# Patient Record
Sex: Female | Born: 1965 | Race: Black or African American | Hispanic: No | Marital: Married | Smoking: Never smoker
Health system: Southern US, Community
[De-identification: ages and names within clinical notes are randomized; demographics above are authoritative.]

## PROBLEM LIST (undated history)

## (undated) DIAGNOSIS — F419 Anxiety disorder, unspecified: Secondary | ICD-10-CM

## (undated) DIAGNOSIS — I10 Essential (primary) hypertension: Secondary | ICD-10-CM

## (undated) DIAGNOSIS — M1990 Unspecified osteoarthritis, unspecified site: Secondary | ICD-10-CM

## (undated) HISTORY — PX: OTHER SURGICAL HISTORY: SHX169

## (undated) HISTORY — DX: Unspecified osteoarthritis, unspecified site: M19.90

## (undated) HISTORY — DX: Anxiety disorder, unspecified: F41.9

## (undated) HISTORY — DX: Essential (primary) hypertension: I10

---

## 2001-09-28 ENCOUNTER — Other Ambulatory Visit: Admission: RE | Admit: 2001-09-28 | Discharge: 2001-09-28 | Payer: Self-pay | Admitting: Family Medicine

## 2002-11-14 ENCOUNTER — Other Ambulatory Visit: Admission: RE | Admit: 2002-11-14 | Discharge: 2002-11-14 | Payer: Self-pay | Admitting: Obstetrics and Gynecology

## 2002-11-20 ENCOUNTER — Encounter: Payer: Self-pay | Admitting: Obstetrics and Gynecology

## 2002-11-20 ENCOUNTER — Ambulatory Visit (HOSPITAL_COMMUNITY): Admission: RE | Admit: 2002-11-20 | Discharge: 2002-11-20 | Payer: Self-pay | Admitting: Obstetrics and Gynecology

## 2003-08-23 ENCOUNTER — Emergency Department (HOSPITAL_COMMUNITY): Admission: EM | Admit: 2003-08-23 | Discharge: 2003-08-23 | Payer: Self-pay | Admitting: Family Medicine

## 2003-11-08 ENCOUNTER — Other Ambulatory Visit: Admission: RE | Admit: 2003-11-08 | Discharge: 2003-11-08 | Payer: Self-pay | Admitting: Obstetrics and Gynecology

## 2004-05-16 ENCOUNTER — Ambulatory Visit (HOSPITAL_COMMUNITY): Admission: RE | Admit: 2004-05-16 | Discharge: 2004-05-16 | Payer: Self-pay | Admitting: Obstetrics and Gynecology

## 2004-05-29 ENCOUNTER — Ambulatory Visit (HOSPITAL_COMMUNITY): Admission: RE | Admit: 2004-05-29 | Discharge: 2004-05-29 | Payer: Self-pay | Admitting: Obstetrics and Gynecology

## 2004-06-02 ENCOUNTER — Encounter (INDEPENDENT_AMBULATORY_CARE_PROVIDER_SITE_OTHER): Payer: Self-pay | Admitting: Specialist

## 2004-06-02 ENCOUNTER — Inpatient Hospital Stay (HOSPITAL_COMMUNITY): Admission: RE | Admit: 2004-06-02 | Discharge: 2004-06-08 | Payer: Self-pay | Admitting: Obstetrics and Gynecology

## 2004-06-12 ENCOUNTER — Inpatient Hospital Stay (HOSPITAL_COMMUNITY): Admission: AD | Admit: 2004-06-12 | Discharge: 2004-06-14 | Payer: Self-pay | Admitting: Obstetrics and Gynecology

## 2004-06-13 ENCOUNTER — Encounter (INDEPENDENT_AMBULATORY_CARE_PROVIDER_SITE_OTHER): Payer: Self-pay | Admitting: *Deleted

## 2004-07-15 ENCOUNTER — Other Ambulatory Visit: Admission: RE | Admit: 2004-07-15 | Discharge: 2004-07-15 | Payer: Self-pay | Admitting: Obstetrics and Gynecology

## 2005-02-08 ENCOUNTER — Emergency Department (HOSPITAL_COMMUNITY): Admission: EM | Admit: 2005-02-08 | Discharge: 2005-02-08 | Payer: Self-pay | Admitting: Emergency Medicine

## 2005-09-18 ENCOUNTER — Other Ambulatory Visit: Admission: RE | Admit: 2005-09-18 | Discharge: 2005-09-18 | Payer: Self-pay | Admitting: Obstetrics and Gynecology

## 2006-09-27 ENCOUNTER — Other Ambulatory Visit: Admission: RE | Admit: 2006-09-27 | Discharge: 2006-09-27 | Payer: Self-pay | Admitting: Obstetrics and Gynecology

## 2007-07-18 ENCOUNTER — Encounter: Admission: RE | Admit: 2007-07-18 | Discharge: 2007-07-18 | Payer: Self-pay | Admitting: Internal Medicine

## 2007-07-28 ENCOUNTER — Encounter: Admission: RE | Admit: 2007-07-28 | Discharge: 2007-07-28 | Payer: Self-pay | Admitting: Internal Medicine

## 2008-01-22 ENCOUNTER — Emergency Department (HOSPITAL_COMMUNITY): Admission: EM | Admit: 2008-01-22 | Discharge: 2008-01-22 | Payer: Self-pay | Admitting: Family Medicine

## 2009-09-02 ENCOUNTER — Encounter: Admission: RE | Admit: 2009-09-02 | Discharge: 2009-09-02 | Payer: Self-pay | Admitting: Internal Medicine

## 2010-10-07 ENCOUNTER — Other Ambulatory Visit: Payer: Self-pay | Admitting: Obstetrics and Gynecology

## 2010-11-28 NOTE — Discharge Summary (Signed)
NAMEEUGENE, ZEIDERS                ACCOUNT NO.:  [id number]   MEDICAL RECORD NO.:  [id number]          PATIENT TYPE:  INP   LOCATION:  9107                          [facility]ILITY[facility]  [facility]   PHYSICIAN:  Rudy Jew. Ashley Royalty, M.D.DATE OF BIRTH:  August 03, 1965   DATE OF ADMISSION:  06/02/2004  DATE OF DISCHARGE:  06/08/2004                                 DISCHARGE SUMMARY   DISCHARGE DIAGNOSES:  1.  Intrauterine pregnancy at 40 weeks and 4 days gestation, delivered.  2.  Large for gestational age infant.  3.  Meconium-stained amniotic fluid.  4.  Postpartum preeclampsia, with hemolysis, elevated liver enzymes, and low      platelet count syndrome (HELLP).   CONSULTATIONS:  None.   DISCHARGE MEDICATIONS:  1.  Labetalol 100 mg p.o. b.i.d.  2.  Motrin.  3.  Percocet.   HISTORY AND PHYSICAL:  This is a 45 year old gravida 3, para 0, AB 2, with  an EDC of May 29, 2004, 40 weeks and 4 days gestation.  Prenatal care  was complicated by advanced maternal age, status post myomectomy at [facility]  with no violation of the endometrial cavity, positive HSV type 2, and  polyhydramnios.  The patient did have an amniocentesis for advanced maternal  age which revealed a normal fetus chromosomally.  Ultrasound on May 29, 2004 revealed an estimated fetal weight of approximately 4120 g.  After full  discussion of the risks and benefits of vaginal versus abdominal delivery,  the patient elected to proceed with primary cesarean section.  For the  remainder of the history and physical, please see the chart.   HOSPITAL COURSE:  The patient was admitted to [facility]  [place].  Admission laboratory studies were drawn.  On June 02, 2004,  she was taken to the operating room and underwent primary low transverse  cesarean section.  The procedure was accomplished by Dr. Sylvester Harder.  She delivered a 9 pound 0 ounce female with Apgars of 8 at one minute and 9  at five minutes who was sent to  the newborn nursery.  Arterial cord pH was  7.24.   The patient's initial postpartum course was complicated by thrombocytopenia  with a level of 101,000.  On the first postpartum day, the patient's blood  pressure was noted to be elevated.  A diagnosis of HELLP syndrome was made.  She was begun on magnesium sulfate therapy.  SGOT on November 22nd was 61.  On November 23rd, magnesium sulfate was continued.  The patient was begun on  labetalol 100 mg p.o. b.i.d. for blood pressures in the 165-170 range  systolic and 88 range diastolic.  On November 24th, the labetalol was  increased to 200 mg p.o. b.i.d.  Magnesium sulfate was discontinued.  The  patient was transferred to the floor.  On November 27th, the patient's SGOT  was noted to be diminishing at 41.  Platelet count was 176,000.  Blood  pressures were in the 130 range systolic and 70 range diastolic.  At this  point, the patient was felt to be stable for discharge.  She  was discharged  home on the aforementioned medications, afebrile and in satisfactory  condition.   DISPOSITION:  The patient is to return to [facility] and Obstetrics  in three to four days for assessment of her blood pressure and postoperative  assessment as well.      JAM/MEDQ  D:  07/03/2004  T:  07/04/2004  Job:  [id number]

## 2010-11-28 NOTE — Op Note (Signed)
[name] Skinner, [name] Skinner                ACCOUNT NO.:  [id number]   MEDICAL RECORD NO.:  [id number]          PATIENT TYPE:  INP   LOCATION:  9107                          [facility]   PHYSICIAN:  Rudy Jew. Ashley Royalty, M.D.DATE OF BIRTH:  22-Mar-1966   DATE OF PROCEDURE:  06/02/2004  DATE OF DISCHARGE:  06/08/2004                                 OPERATIVE REPORT   PREOPERATIVE DIAGNOSES:  1.  Intrauterine pregnancy at 40 weeks 4 days' gestation.  2.  Large for gestational age infant.   POSTOPERATIVE DIAGNOSES:  1.  Intrauterine pregnancy at 40 weeks 4 days' gestation.  2.  Meconium-stained amniotic fluid.   PROCEDURE:  Primary low transverse cesarean section.   SURGEON:  Rudy Jew. Ashley Royalty, M.D.   ASSISTANT:  Bing Neighbors. Sydnee Cabal, M.D.   ANESTHESIA:  Spinal.   FINDINGS:  A 9 pound 0 ounce female, Apgars 8 at one minute, 9 at five  minutes, sent to newborn nursery.  Arterial cord pH 7.24.   ESTIMATED BLOOD LOSS:  1000 mL.   COMPLICATIONS:  None.   PACKS AND DRAINS:  Foley.   Sponge, needle, and instrument count reported as correct x2.   PROCEDURE:  The patient was taken to the operating room and placed in the  sitting position.  After a spinal anesthetic was administered, she was  placed in the dorsal supine position and prepped and draped in the usual  manner for abdominal surgery.  A Foley catheter was placed.  The  Pfannenstiel incision was made down to the level of the fascia, which was  nicked with a knife, incised transversely with Mayo scissors.  The  underlying rectus muscles were separated from the overlying fascia using  sharp and blunt dissection.  The rectus muscles were separated in the  midline exposing the peritoneum, which was elevated with hemostats and  entered atraumatically with Metzenbaum scissors.  The incision was extended  longitudinally.  The uterus was identified and the bladder flap created by  incising the intrauterine serosa.  The bladder was  dissected inferiorly and  held in place with a bladder blade.  The uterus was then entered with a  combination of sharp and blunt dissection through a low transverse incision.  Meconium-stained amniotic fluid was encountered.  The infant was then  delivered from a vertex presentation.  The DeLee trap was used at delivery  of the head.  The pediatrics team was present at delivery.  At full  delivery, the umbilical cord was triply clamped, cut, and the infant given  immediately to the awaiting pediatrics team.  Arterial cord pH was obtained  from an isolated segment.  Regular cord blood was obtained.  Placenta and  membranes were delivered in their entirety and submitted to pathology for  histologic study.  The uterus was then closed with two running layers of #1  Vicryl.  The first was a running locking layer.  The second was a running,  intermittently locked and imbricating layer.  Hemostasis was noted.  The  uterus, tubes, and ovaries were found to be normal and returned to the  abdominal cavity.  Copious irrigation was accomplished.  The fascia was then  closed with 0 Vicryl in a running fashion.  The skin was closed with  staples.  The patient tolerated the procedure extremely well and was  returned to the recovery room in good condition.      JAM/MEDQ  D:  07/03/2004  T:  07/03/2004  Job:  [id number]

## 2010-11-28 NOTE — H&P (Signed)
[name] Skinner, BUCZKOWSKI                ACCOUNT NO.:  [id number]   MEDICAL RECORD NO.:  [id number]          PATIENT TYPE:  INP   LOCATION:  9198                          [facility]   PHYSICIAN:  Rudy Jew. Ashley Royalty, M.D.DATE OF BIRTH:  1965-09-03   DATE OF ADMISSION:  06/02/2004  DATE OF DISCHARGE:                                HISTORY & PHYSICAL   HISTORY OF PRESENT ILLNESS:  This is a 45 year old gravida 3, para 0, AB2,  EDC May 29, 2004, 40 weeks 4 days gestation.  Prenatal care has been  complicated by advanced maternal age, status post myomectomy at [facility] with  no violation of the cavity, HSV type 2 - gentle, polyhydramnios.  The  patient did have an amniocentesis for advanced maternal age.  The results  revealed a normal fetus chromosomally.  The patient had an ultrasound  performed May 29, 2004, due to the fact the presenting part was out of  the pelvis, and her cervix was closed.  Ultrasound revealed an estimated  fetal weight of approximately 4120 grams.  It was felt to be approximately  the 90th percentile for a [redacted] week gestation.  At that time, the amniotic  fluid volume was felt to be normal.  The patient is for C-section for large  for gestational age infant.   MEDICATIONS:  Vitamins.   PAST MEDICAL HISTORY:  As above.   PAST SURGICAL HISTORY:  As above.   ALLERGIES:  None.   FAMILY HISTORY:  Noncontributory.   SOCIAL HISTORY:  The patient denies the use of tobacco or significant  alcohol.   REVIEW OF SYSTEMS:  Noncontributory.   PHYSICAL EXAMINATION:  GENERAL:  Well-developed, well-nourished, pleasant  black female in no acute distress.  Afebrile.  VITAL SIGNS:  Stable.  SKIN:  Warm and dry without lesions.  LYMPHATIC:  There is no supraclavicular, cervical or inguinal adenopathy.  HEENT:  Normocephalic.  NECK:  Supple without thyromegaly.  CHEST:  Lungs are clear.  CARDIAC:  Regular rate and rhythm without murmurs, gallops or rubs.  BREASTS:  Exam  is deferred.  ABDOMEN:  Gravid with a term fundal height.  Fetal heart tones are  auscultated.  MUSCULOSKELETAL:  Examination reveals full range of motion.  PELVIC:  Examination (May 30, 2004) revealed the cervix to be closed  with the presenting part out of the pelvis.   IMPRESSION:  Large for gestational age infant.   PLAN:  I discussed with the patient the alternatives of induction versus  primary cesarean section for large for gestational infant.  Issues of  shoulder dystocia discussed.  Risks, benefits, alternatives of C-section  were discussed versus vaginal delivery fully discussed.  The patient elects  to go with the primary cesarean section.  Questions invited and answered.      JAM/MEDQ  D:  06/02/2004  T:  06/02/2004  Job:  [id number]

## 2010-11-28 NOTE — Consult Note (Signed)
[name] Skinner, [name] Skinner                ACCOUNT NO.:  [id number]   MEDICAL RECORD NO.:  [id number]          PATIENT TYPE:  INP   LOCATION:  9373                          [facility]   PHYSICIAN:  Corinna L. Lendell [name], MDDATE OF BIRTH:  Oct 15, 1965   DATE OF CONSULTATION:  06/12/2004  DATE OF DISCHARGE:  06/14/2004                                   CONSULTATION   REASON FOR CONSULTATION:  Hypertension.   IMPRESSIONS/RECOMMENDATIONS:  1.  Pregnancy-induced hypertension versus essential hypertension:  The      patient reports borderline high blood pressure of about 140/85      prepregnancy.  She has a strong family history of hypertension.  She      therefore may have essential hypertension.  I will add      hydrochlorothiazide and increase her labetalol to 200 mg twice a day.  I      will also give Clonidine as needed for uncontrolled hypertension.  In      addition, I will check a UA, a urine protein to creatinine ratio.  2.  Peripheral edema:  The patient also reports orthopnea.  She sleeps in      recliner.  I will get a chest x-ray, B-type natriuretic peptide, EKG,      TSH, and echocardiogram to rule out congestive heart failure or      cardiomyopathy.   HISTORY OF PRESENT ILLNESS:  [name] Skinner is a 45 year old black female, who  is status post C-section a little over a week ago.  She reportedly had  postpartum  hypertension and elevated liver function tests.  She received IV  magnesium and apparently was discharged.  She was directly admitted from Dr.  Ashley Royalty' office with a blood pressure of 168/100 and edema.  She has been  on labetalol 100 mg twice a day.  I was asked to assist with her  hypertension and edema.   PAST MEDICAL HISTORY:  None.   MEDICATIONS:  Labetalol 100 mg b.i.d.   SOCIAL HISTORY:  The patient denies drinking or drugs.   FAMILY HISTORY:  Significant for hypertension.   REVIEW OF SYSTEMS:  As above, otherwise negative.   PHYSICAL EXAMINATION:  VITAL  SIGNS:  Blood pressure is currently 150/80,  temperature 97.1, pulse 90, respiratory rate 20.  GENERAL:  The patient is an overweight black female in no acute distress.  HEENT:  Normocephalic, atraumatic.  Pupils equal, round, and reactive to  light.  Sclerae are nonicteric.  No papilledema.  Moist mucous membranes.  NECK:  Supple.  No lymphadenopathy.  No thyromegaly.  LUNGS:  Clear to auscultation bilaterally without wheezes, rhonchi, or  rales.  CARDIOVASCULAR:  Regular rate and rhythm without murmurs, gallops, rubs.  ABDOMEN:  Normal bowel sounds, soft, nontender, nondistended.  GU/RECTAL:  Deferred.  EXTREMITIES:  She has 3+ pitting edema.   Labs are at this time pending.   ASSESSMENT AND PLAN:  As above.   I would like to thank Dr. Ashley Royalty for this consultation.  Further  recommendations to follow.      CLS/MEDQ  D:  08/27/2004  T:  08/27/2004  Job:  [id number]

## 2010-11-28 NOTE — Discharge Summary (Signed)
[name] Skinner, [name] Skinner                ACCOUNT NO.:  [id number]   MEDICAL RECORD NO.:  [id number]          PATIENT TYPE:  INP   LOCATION:  9373                          [facility]   PHYSICIAN:  James A. Ashley Royalty, M.D.DATE OF BIRTH:  July 15, 1965   DATE OF ADMISSION:  06/12/2004  DATE OF DISCHARGE:  06/14/2004                                 DISCHARGE SUMMARY   DISCHARGE DIAGNOSIS:  Probable chronic hypertension with superimposed  preeclampsia (hemolysis, elevated liver function, and low platelet  syndrome).   OPERATIONS AND SPECIAL PROCEDURES:  None.   CONSULTATIONS:  Crista Curb, M.D. [facility]).   DISCHARGE MEDICATIONS:  1.  Hydrochlorothiazide 25 mg daily.  2.  Atenolol 25 mg one-half tablet b.i.d.  3.  Vitamins.  4.  Colace.   HISTORY OF PRESENT ILLNESS:  This is a 45 year old gravida 2 para 1 AB 1  status post cesarean section June 02, 2004.  The patient's postpartum  course was complicated by hypertension with elevated liver enzymes.  She was  felt to have initially preeclampsia with HELLP syndrome.  She was treated  with magnesium sulfate and later with labetalol.  The magnesium sulfate was  discontinued and the patient was allowed to be discharged home on labetalol.   The patient returned on the day of admission for blood pressure check at the  office.  She denied any visual disturbances, epigastric, or right upper  quadrant pain or headaches.  She did complain of some persistent dependent  edema.  Blood pressure in the office was 168 systolic, 100 diastolic.  The  patient was noted to have 2-3+ dependent edema.  As a precaution, pregnancy-  induced hypertension panel was obtained the same day.  The results showed a  hemoglobin of 8.9, uric acid 6.8, SGOT elevated at 80, and SGPT elevated at  76.  The patient was admitted for further evaluation and therapy.  For the  remainder of the history and physical, please see chart.   HOSPITAL COURSE:  The  patient was admitted to [facility]  [place].  Admission laboratory studies were drawn.  [facility]  were consulted.  There was a handwritten note in the chart.  At the time of  this dictation I could not find a dictated note in the chart, though such  was mentioned in the written note.  At any rate, the hypertension workup was  performed.  Chest x-ray revealed enlargement of the cardiomediastinal  silhouette without edema or focal infiltrate.  A 2-D echocardiogram was  obtained.  The left ventricular ejection fraction was estimated at 55-65%.  The study was felt to be inadequate for evaluation of LV motion.  Left  ventricular wall thickness was noted to be upper limits of normal.  The  patient's medications were adjusted per [facility].  On June 14, 2004 the blood pressures were noted to be normalizing nicely.  The patient  was then felt to be a candidate for discharge and was discharged home  afebrile and in satisfactory condition.  Appropriate appointments were made  with [facility] as well  as the [facility] for close follow-up.   ACCESSORY CLINICAL FINDINGS:  Hemoglobin and hematocrit on admission were  9.1 and 26.8 respectively.  Repeat values obtained June 14, 2004 were  10.5 and 31.4 respectively.  PIH panel obtained June 14, 2004 revealed  SGOT of 45, SGPT of 61.  TSH was 1.26.   DISPOSITION:  The patient is to return to [facility] and [facility]s  in several days for additional evaluation and therapy.      JAM/MEDQ  D:  07/03/2004  T:  07/04/2004  Job:  [id number]

## 2010-11-28 NOTE — H&P (Signed)
[name], [name]                ACCOUNT NO.:  [id number]   MEDICAL RECORD NO.:  [id number]          PATIENT TYPE:  INP   LOCATION:                                 FACILITY:   PHYSICIAN:  Rudy Jew. Ashley Royalty, M.D.DATE OF BIRTH:  12/17/1965   DATE OF ADMISSION:  06/12/2004  DATE OF DISCHARGE:                                HISTORY & PHYSICAL   This is a 45 year old, gravida 2, para 1, AB 1 status post cesarean section  June 02, 2004. The patient's postpartum course was complicated by  hypertension with elevated liver enzymes.  She was sent to the intensive  care unit and received IV magnesium sulfate for several days. The initial  SGOT on November 22 was 48. The maximum SGOT occurred November 23 and the  value was 61.  Subsequent values diminished throughout the remainder of the  patient's hospitalization.  The patient was discharged on June 08, 2004  with the SGOT trending downward at that point at a level of 41.  The SGPT  was never elevated. The patient was also anemic postoperatively having gone  from a preoperative hemoglobin of 11.6 to 7.5.  She was not symptomatic at  discharge and was discharged home on analgesics and iron. She was also  discharged home on labetalol 200 mg b.i.d.   The patient returned today for blood pressure check at the office.  She  denies any visual disturbances, epigastric or right upper quadrant pain,  headaches.  She does complain of some persistent dependent edema.  She  denies any shortness of breath.  Her exam was positive in the office for a  blood pressure of 168/100 and 2 to 3+ dependent edema.  In the interim, she  had been diminished to labetalol 100 mg b.i.d. after some interim pressures  had noted to be on the low side.  As a precaution, she was sent for a  subsequent PIH panel today. The results showed a hemoglobin of 8.9.  Uric  acid was 6.8.  SGOT was 80, SGPT was 76.  The patient was hence readmitted  for further evaluation and  therapy.   MEDICATIONS:  Labetalol 100 mg b.i.d., Chromagen analgesics.   PAST MEDICAL HISTORY:  Negative.   PAST SURGICAL HISTORY:  Status post cesarean section. Status post  myomectomy.   ALLERGIES:  None.   FAMILY HISTORY:  Noncontributory.   SOCIAL HISTORY:  The patient denies the use of tobacco or significant  alcohol.   REVIEW OF SYMPTOMS:  Noncontributory.   PHYSICAL EXAMINATION:  GENERAL:  Well-developed, well-nourished, pleasant  black female in no acute distress.  VITAL SIGNS:  Afebrile, vital signs as above.  SKIN:  Warm and dry without lesions.  LYMPH:  There is no supraclavicular, cervical or inguinal adenopathy.  HEENT:  Normocephalic.  NECK:  Supple without thyromegaly.  CHEST:  Lungs are clear.  CARDIAC:  Regular rate and rhythm without murmur, gallop or rub.  BREASTS:  Deferred.  ABDOMEN:  Soft and nontender without masses or organomegaly.  The abdominal  incision is healing quite well.  MUSCULOSKELETAL:  Reveals 2  to 3+ dependent edema.  DTR's appear 2+ and  equal.  PELVIC:  Deferred.   LABORATORY DATA:  As above.   IMPRESSION:  1.  Status post cesarean section June 02, 2004.  2.  Preeclampsia with elevated liver enzymes--initially resolving with      recent exacerbation.  3.  Rule out chronic hypertension.   PLAN:  Readmit for further evaluation and therapy.  The patient will be  reinitiated on magnesium sulfate.  In addition will consult the [facility]  hospitalist.  Discussed with Dr. Sydnee Cabal who is covering for the patient  this evening.      JAM/MEDQ  D:  06/12/2004  T:  06/12/2004  Job:  [id number]

## 2011-02-12 ENCOUNTER — Emergency Department (HOSPITAL_COMMUNITY)
Admission: EM | Admit: 2011-02-12 | Discharge: 2011-02-12 | Disposition: A | Discharge status: Home / Self Care | Payer: Private Health Insurance - Indemnity | Attending: Emergency Medicine | Admitting: Emergency Medicine

## 2011-02-12 DIAGNOSIS — E876 Hypokalemia: Secondary | ICD-10-CM | POA: Insufficient documentation

## 2011-02-12 DIAGNOSIS — M79609 Pain in unspecified limb: Secondary | ICD-10-CM | POA: Insufficient documentation

## 2011-02-12 DIAGNOSIS — I10 Essential (primary) hypertension: Secondary | ICD-10-CM | POA: Insufficient documentation

## 2011-02-12 DIAGNOSIS — R252 Cramp and spasm: Secondary | ICD-10-CM | POA: Insufficient documentation

## 2011-02-12 LAB — DIFFERENTIAL
Basophils Absolute: 0.1 10*3/uL (ref 0.0–0.1)
Basophils Relative: 1 % (ref 0–1)
Eosinophils Absolute: 0.4 10*3/uL (ref 0.0–0.7)
Eosinophils Relative: 4 % (ref 0–5)
Lymphocytes Relative: 28 % (ref 12–46)
Lymphs Abs: 2.9 10*3/uL (ref 0.7–4.0)
Monocytes Absolute: 0.6 10*3/uL (ref 0.1–1.0)
Monocytes Relative: 6 % (ref 3–12)
Neutro Abs: 6.5 10*3/uL (ref 1.7–7.7)
Neutrophils Relative %: 62 % (ref 43–77)

## 2011-02-12 LAB — CBC
HCT: 32.2 % — ABNORMAL LOW (ref 36.0–46.0)
Hemoglobin: 10.8 g/dL — ABNORMAL LOW (ref 12.0–15.0)
MCH: 29.1 pg (ref 26.0–34.0)
MCHC: 33.5 g/dL (ref 30.0–36.0)
MCV: 86.8 fL (ref 78.0–100.0)
Platelets: 250 10*3/uL (ref 150–400)
RBC: 3.71 MIL/uL — ABNORMAL LOW (ref 3.87–5.11)
RDW: 13.5 % (ref 11.5–15.5)
WBC: 10.5 10*3/uL (ref 4.0–10.5)

## 2011-02-12 LAB — COMPREHENSIVE METABOLIC PANEL
ALT: 13 U/L (ref 0–35)
AST: 15 U/L (ref 0–37)
Albumin: 3 g/dL — ABNORMAL LOW (ref 3.5–5.2)
Alkaline Phosphatase: 41 U/L (ref 39–117)
BUN: 11 mg/dL (ref 6–23)
CO2: 28 mEq/L (ref 19–32)
Calcium: 9.5 mg/dL (ref 8.4–10.5)
Chloride: 99 mEq/L (ref 96–112)
Creatinine, Ser: 0.84 mg/dL (ref 0.50–1.10)
GFR calc Af Amer: >60 mL/min (ref >60)
GFR calc non Af Amer: >60 mL/min (ref >60)
Glucose, Bld: 144 mg/dL — ABNORMAL HIGH (ref 70–99)
Potassium: 2.8 mEq/L — ABNORMAL LOW (ref 3.5–5.1)
Sodium: 138 mEq/L (ref 135–145)
Total Bilirubin: 0.2 mg/dL — ABNORMAL LOW (ref 0.3–1.2)
Total Protein: 7.8 g/dL (ref 6.0–8.3)

## 2011-07-17 ENCOUNTER — Other Ambulatory Visit: Payer: Self-pay | Admitting: Internal Medicine

## 2011-07-17 DIAGNOSIS — Z1231 Encounter for screening mammogram for malignant neoplasm of breast: Secondary | ICD-10-CM

## 2011-08-03 ENCOUNTER — Ambulatory Visit
Admission: RE | Admit: 2011-08-03 | Discharge: 2011-08-03 | Disposition: A | Discharge status: Home / Self Care | Payer: Private Health Insurance - Indemnity | Source: Ambulatory Visit | Attending: Internal Medicine | Admitting: Internal Medicine

## 2011-08-03 DIAGNOSIS — Z1231 Encounter for screening mammogram for malignant neoplasm of breast: Secondary | ICD-10-CM

## 2012-06-10 ENCOUNTER — Ambulatory Visit (INDEPENDENT_AMBULATORY_CARE_PROVIDER_SITE_OTHER): Payer: Managed Care, Other (non HMO) | Admitting: Emergency Medicine

## 2012-06-10 VITALS — BP 128/76 | HR 150 | Temp 100.7°F | Resp 18 | Ht 64.0 in | Wt 257.0 lb

## 2012-06-10 DIAGNOSIS — J209 Acute bronchitis, unspecified: Secondary | ICD-10-CM

## 2012-06-10 MED ORDER — HYDROCOD POLST-CHLORPHEN POLST 10-8 MG/5ML PO LQCR: 5.0000 mL | Freq: Two times a day (BID) | ORAL | Status: DC | PRN

## 2012-06-10 MED ORDER — AZITHROMYCIN 250 MG PO TABS: ORAL_TABLET | ORAL | Status: DC

## 2012-06-10 NOTE — Progress Notes (Signed)
[facility] [address] [place] [address] [phone]  Date:  06/10/2012   Name:  [name] Skinner   DOB:  1966/05/25   MRN:  [id number]  PCP:  No primary provider on file.    Chief Complaint: Cough and Headache   History of Present Illness:  [name] Skinner is a 46 y.o. very pleasant female patient who presents with the following:  Ill yesterday with cough and sore throat.  Has frontal headache.  No shortness of breath or wheezing.  Has fever 101.2.  No chills, nausea or vomiting.  No nasal discharge or drainage.  Non productive cough.  No improvement with OTC.  Appetite fine.  There is no problem list on file for this patient.   Past Medical History  Diagnosis Date  . Hypertension     Past Surgical History  Procedure Date  . Fibroid     History  Substance Use Topics  . Smoking status: Never Smoker   . Smokeless tobacco: Not on file  . Alcohol Use: No    Family History  Problem Relation Age of Onset  . Hypertension Mother   . Hypertension Father   . Diabetes Maternal Grandmother     No Known Allergies  Medication list has been reviewed and updated.  Current Outpatient Prescriptions on File Prior to Visit  Medication Sig Dispense Refill  . olmesartan-hydrochlorothiazide (BENICAR HCT) 40-25 MG per tablet Take 1 tablet by mouth daily.        Review of Systems:  As per HPI, otherwise negative.    Physical Examination: Filed Vitals:   06/10/12 1652  BP: 128/76  Pulse: 150  Temp: 100.7 F (38.2 C)  Resp: 18   Filed Vitals:   06/10/12 1652  Height: 5\' 4"  (1.626 m)  Weight: 257 lb (116.574 kg)   Body mass index is 44.11 kg/(m^2). Ideal Body Weight: Weight in (lb) to have BMI = 25: 145.3   GEN: WDWN, NAD, Non-toxic, A & O x 3  No rash or sepsis HEENT: Atraumatic, Normocephalic. Neck supple. No masses, No LAD.  Oropharynx negative Ears and Nose: No external deformity. CV: RRR, No M/G/R. No JVD. No thrill. No extra heart  sounds. PULM: CTA B, no wheezes, crackles, rhonchi. No retractions. No resp. distress. No accessory muscle use. ABD: S, NT, ND, +BS. No rebound. No HSM. EXTR: No c/c/e NEURO Normal gait.  PSYCH: Normally interactive. Conversant. Not depressed or anxious appearing.  Calm demeanor.    Assessment and Plan: Bronchitis zpak tussionex Follow up as needed  Carmelina Dane, MD

## 2012-06-20 NOTE — Progress Notes (Signed)
Reviewed and agree.

## 2012-07-22 ENCOUNTER — Other Ambulatory Visit: Payer: Self-pay | Admitting: Internal Medicine

## 2012-07-22 DIAGNOSIS — Z1231 Encounter for screening mammogram for malignant neoplasm of breast: Secondary | ICD-10-CM

## 2012-08-29 ENCOUNTER — Ambulatory Visit
Admission: RE | Admit: 2012-08-29 | Discharge: 2012-08-29 | Disposition: A | Discharge status: Home / Self Care | Payer: Self-pay | Source: Ambulatory Visit | Attending: Internal Medicine | Admitting: Internal Medicine

## 2012-08-29 DIAGNOSIS — Z1231 Encounter for screening mammogram for malignant neoplasm of breast: Secondary | ICD-10-CM

## 2012-10-30 ENCOUNTER — Ambulatory Visit (INDEPENDENT_AMBULATORY_CARE_PROVIDER_SITE_OTHER): Payer: Managed Care, Other (non HMO) | Admitting: Family Medicine

## 2012-10-30 VITALS — BP 140/83 | HR 99 | Temp 98.2°F | Resp 16 | Ht 64.0 in | Wt 260.0 lb

## 2012-10-30 DIAGNOSIS — L259 Unspecified contact dermatitis, unspecified cause: Secondary | ICD-10-CM

## 2012-10-30 DIAGNOSIS — H0019 Chalazion unspecified eye, unspecified eyelid: Secondary | ICD-10-CM

## 2012-10-30 DIAGNOSIS — H02849 Edema of unspecified eye, unspecified eyelid: Secondary | ICD-10-CM

## 2012-10-30 MED ORDER — AZELASTINE HCL 0.05 % OP SOLN: 1.0000 [drp] | Freq: Two times a day (BID) | OPHTHALMIC | Status: DC

## 2012-10-30 MED ORDER — PREDNISONE 20 MG PO TABS: 40.0000 mg | ORAL_TABLET | Freq: Every day | ORAL | Status: DC

## 2012-10-30 NOTE — Progress Notes (Signed)
Subjective:    Patient ID: [name] Skinner, female    DOB: 1966/06/16, 47 y.o.   MRN: [id number]  HPI [name] Skinner is a 47 y.o. female Hx of puffy eyes - below eyes. Did have eyelashes placed 2 days ago - had tape on undersurface of eye.  No initial problems, but noticed itching under eyes. Did have redness in area on prior time of having tape applied.  Noticed swelling next morning (yesterday am).  Took 2 benadryl yesterday and today.  Less swelling in lower area under eye, but irritated in lower eyelid area. Able to see normally.  r eye watering some this morning.  No known hx of allergies.   No hx of DM - A1C 5.9 last month.   Review of Systems  Constitutional: Negative for fever and chills.  HENT: Negative for congestion, rhinorrhea and sneezing.   Eyes: Positive for discharge (watery on right - this am. ). Negative for photophobia and visual disturbance.  Respiratory: Negative for cough.       Objective:   Physical Exam  Vitals reviewed. Constitutional: She is oriented to person, place, and time. She appears well-developed and well-nourished. No distress.  HENT:  Head: Normocephalic and atraumatic.  Eyes: EOM are normal. Pupils are equal, round, and reactive to light. Right conjunctiva is injected (minimal injection on R lateral sclera/conjunctiva. no exudate at canthi. ). Right conjunctiva has no hemorrhage. Left conjunctiva is not injected. Left conjunctiva has no hemorrhage.    Pulmonary/Chest: Effort normal.  Lymphadenopathy:    She has no cervical adenopathy (no preauricular LAD. ).  Neurological: She is alert and oriented to person, place, and time.  Skin: Skin is warm and dry. She is not diaphoretic.  See eye exam.   Psychiatric: She has a normal mood and affect. Her behavior is normal.       Assessment & Plan:  [name] is a 47 y.o. female Contact dermatitis - Plan: azelastine (OPTIVAR) 0.05 % ophthalmic solution, predniSONE (DELTASONE) 20 MG tablet  Eyelid gland  swelling, unspecified laterality - Plan: azelastine (OPTIVAR) 0.05 % ophthalmic solution, predniSONE (DELTASONE) 20 MG tablet  Suspected contact derm/irritation from tape applied during eyelid application.  Allegra qd, benadryl qhs, 3 day course of prednisone - SED. optivar if needed and rtc precautions.   Meds ordered this encounter  Medications  . azelastine (OPTIVAR) 0.05 % ophthalmic solution    Sig: Place 1 drop into the right eye 2 (two) times daily.    Dispense:  6 mL    Refill:  0  . predniSONE (DELTASONE) 20 MG tablet    Sig: Take 2 tablets (40 mg total) by mouth daily.    Dispense:  6 tablet    Refill:  0    Patient Instructions  You appear to have a reaction to the tape that was applied around your eyes.  Avoid using the same tape in the future. Start the prednisone today, allegra - one per day, and if needed can take 1 benadryl at bedtime. You can also use the eye drops in the left eye next few days if needed. Return to the clinic or go to the nearest emergency room if any of your symptoms worsen or new symptoms occur.  recheck if not improving in next 3 days.   Contact Dermatitis Contact dermatitis is a reaction to certain substances that touch the skin. Contact dermatitis can be either irritant contact dermatitis or allergic contact dermatitis. Irritant contact dermatitis does not require previous exposure to  the substance for a reaction to occur.Allergic contact dermatitis only occurs if you have been exposed to the substance before. Upon a repeat exposure, your body reacts to the substance.  CAUSES  Many substances can cause contact dermatitis. Irritant dermatitis is most commonly caused by repeated exposure to mildly irritating substances, such as:  Makeup.  Soaps.  Detergents.  Bleaches.  Acids.  Metal salts, such as nickel. Allergic contact dermatitis is most commonly caused by exposure to:  Poisonous plants.  Chemicals (deodorants,  shampoos).  Jewelry.  Latex.  Neomycin in triple antibiotic cream.  Preservatives in products, including clothing. SYMPTOMS  The area of skin that is exposed may develop:  Dryness or flaking.  Redness.  Cracks.  Itching.  Pain or a burning sensation.  Blisters. With allergic contact dermatitis, there may also be swelling in areas such as the eyelids, mouth, or genitals.  DIAGNOSIS  Your caregiver can usually tell what the problem is by doing a physical exam. In cases where the cause is uncertain and an allergic contact dermatitis is suspected, a patch skin test may be performed to help determine the cause of your dermatitis. TREATMENT Treatment includes protecting the skin from further contact with the irritating substance by avoiding that substance if possible. Barrier creams, powders, and gloves may be helpful. Your caregiver may also recommend:  Steroid creams or ointments applied 2 times daily. For best results, soak the rash area in cool water for 20 minutes. Then apply the medicine. Cover the area with a plastic wrap. You can store the steroid cream in the refrigerator for a "chilly" effect on your rash. That may decrease itching. Oral steroid medicines may be needed in more severe cases.  Antibiotics or antibacterial ointments if a skin infection is present.  Antihistamine lotion or an antihistamine taken by mouth to ease itching.  Lubricants to keep moisture in your skin.  Burow's solution to reduce redness and soreness or to dry a weeping rash. Mix one packet or tablet of solution in 2 cups cool water. Dip a clean washcloth in the mixture, wring it out a bit, and put it on the affected area. Leave the cloth in place for 30 minutes. Do this as often as possible throughout the day.  Taking several cornstarch or baking soda baths daily if the area is too large to cover with a washcloth. Harsh chemicals, such as alkalis or acids, can cause skin damage that is like a burn.  You should flush your skin for 15 to 20 minutes with cold water after such an exposure. You should also seek immediate medical care after exposure. Bandages (dressings), antibiotics, and pain medicine may be needed for severely irritated skin.  HOME CARE INSTRUCTIONS  Avoid the substance that caused your reaction.  Keep the area of skin that is affected away from hot water, soap, sunlight, chemicals, acidic substances, or anything else that would irritate your skin.  Do not scratch the rash. Scratching may cause the rash to become infected.  You may take cool baths to help stop the itching.  Only take over-the-counter or prescription medicines as directed by your caregiver.  See your caregiver for follow-up care as directed to make sure your skin is healing properly. SEEK MEDICAL CARE IF:   Your condition is not better after 3 days of treatment.  You seem to be getting worse.  You see signs of infection such as swelling, tenderness, redness, soreness, or warmth in the affected area.  You have any problems related  to your medicines. Document Released: 06/26/2000 Document Revised: 09/21/2011 Document Reviewed: 12/02/2010 [facility] Patient Information 2013 [place], [place].

## 2012-10-30 NOTE — Patient Instructions (Signed)
You appear to have a reaction to the tape that was applied around your eyes.  Avoid using the same tape in the future. Start the prednisone today, allegra - one per day, and if needed can take 1 benadryl at bedtime. You can also use the eye drops in the left eye next few days if needed. Return to the clinic or go to the nearest emergency room if any of your symptoms worsen or new symptoms occur.  recheck if not improving in next 3 days.   Contact Dermatitis Contact dermatitis is a reaction to certain substances that touch the skin. Contact dermatitis can be either irritant contact dermatitis or allergic contact dermatitis. Irritant contact dermatitis does not require previous exposure to the substance for a reaction to occur.Allergic contact dermatitis only occurs if you have been exposed to the substance before. Upon a repeat exposure, your body reacts to the substance.  CAUSES  Many substances can cause contact dermatitis. Irritant dermatitis is most commonly caused by repeated exposure to mildly irritating substances, such as:  Makeup.  Soaps.  Detergents.  Bleaches.  Acids.  Metal salts, such as nickel. Allergic contact dermatitis is most commonly caused by exposure to:  Poisonous plants.  Chemicals (deodorants, shampoos).  Jewelry.  Latex.  Neomycin in triple antibiotic cream.  Preservatives in products, including clothing. SYMPTOMS  The area of skin that is exposed may develop:  Dryness or flaking.  Redness.  Cracks.  Itching.  Pain or a burning sensation.  Blisters. With allergic contact dermatitis, there may also be swelling in areas such as the eyelids, mouth, or genitals.  DIAGNOSIS  Your caregiver can usually tell what the problem is by doing a physical exam. In cases where the cause is uncertain and an allergic contact dermatitis is suspected, a patch skin test may be performed to help determine the cause of your dermatitis. TREATMENT Treatment includes  protecting the skin from further contact with the irritating substance by avoiding that substance if possible. Barrier creams, powders, and gloves may be helpful. Your caregiver may also recommend:  Steroid creams or ointments applied 2 times daily. For best results, soak the rash area in cool water for 20 minutes. Then apply the medicine. Cover the area with a plastic wrap. You can store the steroid cream in the refrigerator for a "chilly" effect on your rash. That may decrease itching. Oral steroid medicines may be needed in more severe cases.  Antibiotics or antibacterial ointments if a skin infection is present.  Antihistamine lotion or an antihistamine taken by mouth to ease itching.  Lubricants to keep moisture in your skin.  Burow's solution to reduce redness and soreness or to dry a weeping rash. Mix one packet or tablet of solution in 2 cups cool water. Dip a clean washcloth in the mixture, wring it out a bit, and put it on the affected area. Leave the cloth in place for 30 minutes. Do this as often as possible throughout the day.  Taking several cornstarch or baking soda baths daily if the area is too large to cover with a washcloth. Harsh chemicals, such as alkalis or acids, can cause skin damage that is like a burn. You should flush your skin for 15 to 20 minutes with cold water after such an exposure. You should also seek immediate medical care after exposure. Bandages (dressings), antibiotics, and pain medicine may be needed for severely irritated skin.  HOME CARE INSTRUCTIONS  Avoid the substance that caused your reaction.  Keep the  area of skin that is affected away from hot water, soap, sunlight, chemicals, acidic substances, or anything else that would irritate your skin.  Do not scratch the rash. Scratching may cause the rash to become infected.  You may take cool baths to help stop the itching.  Only take over-the-counter or prescription medicines as directed by your  caregiver.  See your caregiver for follow-up care as directed to make sure your skin is healing properly. SEEK MEDICAL CARE IF:   Your condition is not better after 3 days of treatment.  You seem to be getting worse.  You see signs of infection such as swelling, tenderness, redness, soreness, or warmth in the affected area.  You have any problems related to your medicines. Document Released: 06/26/2000 Document Revised: 09/21/2011 Document Reviewed: 12/02/2010 [facility] Patient Information 2013 [place], [place].

## 2012-12-12 ENCOUNTER — Ambulatory Visit (INDEPENDENT_AMBULATORY_CARE_PROVIDER_SITE_OTHER): Payer: Managed Care, Other (non HMO) | Admitting: Internal Medicine

## 2012-12-12 VITALS — BP 130/90 | HR 105 | Temp 98.7°F | Resp 16 | Ht 64.0 in | Wt 260.0 lb

## 2012-12-12 DIAGNOSIS — I10 Essential (primary) hypertension: Secondary | ICD-10-CM | POA: Insufficient documentation

## 2012-12-12 DIAGNOSIS — Z6841 Body Mass Index (BMI) 40.0 and over, adult: Secondary | ICD-10-CM | POA: Insufficient documentation

## 2012-12-12 DIAGNOSIS — F411 Generalized anxiety disorder: Secondary | ICD-10-CM

## 2012-12-12 NOTE — Progress Notes (Signed)
  Subjective:    Patient ID: [name] Skinner, female    DOB: 02-Dec-1965, 47 y.o.   MRN: [id number]  HPIworkplace stress-can't control BP Chest pains with stress PCP-Dr Maggie Font but can't get appt for few weeks [employer] told her to get started with papers today Can't sleep Anxious all the time Overwhelmed by not being able to finish work assignments Feels sick on the way to work/nauseated BP has come down since out of work these last 4 days but goes up at work!!! Her symptoms have been present off and on since 2012 but have gotten much worse over the last 6 weeks  Dr Allyne Gee treated for HTN, Obesity, Borderline DM Current medication Benicar HCT     Review of Systems Review of systems is essentially negative other than that noted in the present illness    Objective:   Physical Exam BP 130/90  Pulse 105  Temp(Src) 98.7 F (37.1 C) (Oral)  Resp 16  Ht 5\' 4"  (1.626 m)  Wt 260 lb (117.935 kg)  BMI 44.61 kg/m2  SpO2 99%  LMP 12/05/2012 HEENT clear Heart regular No peripheral edema Mood is very anxious       Assessment & Plan:  Generalized anxiety disorder Hypertension BMI over 40  Referred to Dr.Milan for therapy of behavioral techniques to control anxiety Will recommend leave of absence or FMLA over the next month to allow intensive counseling She will followup with her primary care doctor over this period of time as well

## 2012-12-12 NOTE — Patient Instructions (Addendum)
Dr Gretchen Short on [address] for anxiety management

## 2012-12-15 ENCOUNTER — Other Ambulatory Visit: Payer: Self-pay | Admitting: Obstetrics and Gynecology

## 2012-12-20 ENCOUNTER — Ambulatory Visit (INDEPENDENT_AMBULATORY_CARE_PROVIDER_SITE_OTHER): Payer: Managed Care, Other (non HMO) | Admitting: Internal Medicine

## 2012-12-20 VITALS — BP 135/75 | HR 114 | Temp 99.4°F | Resp 16 | Ht 64.0 in | Wt 258.0 lb

## 2012-12-20 DIAGNOSIS — I10 Essential (primary) hypertension: Secondary | ICD-10-CM

## 2012-12-20 DIAGNOSIS — Z0271 Encounter for disability determination: Secondary | ICD-10-CM

## 2012-12-20 DIAGNOSIS — F411 Generalized anxiety disorder: Secondary | ICD-10-CM

## 2012-12-20 NOTE — Progress Notes (Signed)
  Subjective:    Patient ID: [name] Skinner, female    DOB: June 20, 1966, 47 y.o.   MRN: [id number]  HPIhas been OOW and does feel somewhat better-first appt w/ Dr Grant Fontana for therapy and behavioral modification is tomorrow Planning aggressive approach to try and limit the time away from work    Review of Systems     Objective:   Physical Exam BP 135/75  Pulse 114  Temp(Src) 99.4 F (37.4 C)  Resp 16  Ht 5\' 4"  (1.626 m)  Wt 258 lb (117.028 kg)  BMI 44.26 kg/m2  LMP 12/05/2012 Pulse 84 at recheck Ht regular Neuro intact Judgement sound      Assessment & Plan:  GAD Htn-improving Overweight-needs longterm plan for conditioning and wt loss when GAD controlled  F/u after counseling 6/30

## 2012-12-20 NOTE — Progress Notes (Deleted)
  Subjective:    Patient ID: [name] Skinner, female    DOB: 1966-06-19, 47 y.o.   MRN: [id number]  HPI Pt in office to have form completed for FMLA      Review of Systems     Objective:   Physical Exam        Assessment & Plan:

## 2012-12-23 ENCOUNTER — Encounter: Payer: Self-pay | Admitting: Internal Medicine

## 2013-01-03 ENCOUNTER — Ambulatory Visit (INDEPENDENT_AMBULATORY_CARE_PROVIDER_SITE_OTHER): Payer: Managed Care, Other (non HMO) | Admitting: Family Medicine

## 2013-01-03 VITALS — BP 128/85 | HR 103 | Temp 98.8°F | Resp 16 | Ht 64.0 in | Wt 259.2 lb

## 2013-01-03 DIAGNOSIS — M76899 Other specified enthesopathies of unspecified lower limb, excluding foot: Secondary | ICD-10-CM

## 2013-01-03 DIAGNOSIS — M6580 Other synovitis and tenosynovitis, unspecified site: Secondary | ICD-10-CM

## 2013-01-03 MED ORDER — TRAMADOL HCL 50 MG PO TABS: 50.0000 mg | ORAL_TABLET | Freq: Every evening | ORAL | Status: DC | PRN

## 2013-01-03 MED ORDER — MELOXICAM 15 MG PO TABS: ORAL_TABLET | ORAL | Status: DC

## 2013-01-03 MED ORDER — CYCLOBENZAPRINE HCL 5 MG PO TABS: 5.0000 mg | ORAL_TABLET | Freq: Three times a day (TID) | ORAL | Status: DC | PRN

## 2013-01-03 NOTE — Progress Notes (Signed)
Complaint: Right hip pain  History of present illness: Patient is having right hip pain for approximately 2 weeks duration. Patient does not remember any specific injury. Patient states that [name] Skinner has had this problem previously in the past. Patient states that [name] Skinner is having pain mostly on the medial aspect near her groin. Patient states it is worse when [name] Skinner tries to flex her hip up such as getting in and out of a car. Patient denies any radiation down the leg, denies any numbness or tingling. Patient denies any association with food, denies any bulging, denies any nausea vomiting, diarrhea or constipation, bladder incontinence dysuria or polyuria. Otherwise patient has been feeling very healthy.  Denies fever, chills, nausea vomiting abdominal pain, dysuria, chest pain, shortness of breath dyspnea on exertion or numbness in extremities   Past Medical History  Diagnosis Date  . Hypertension   . Arthritis    Past Surgical History  Procedure Laterality Date  . Fibroid     History  Substance Use Topics  . Smoking status: Never Smoker   . Smokeless tobacco: Not on file  . Alcohol Use: No   Family History  Problem Relation Age of Onset  . Hypertension Mother   . Hypertension Father   . Diabetes Maternal Grandmother     Physical exam Blood pressure 128/85, pulse 103, temperature 98.8 F (37.1 C), temperature source Oral, resp. rate 16, height 5\' 4"  (1.626 m), weight 259 lb 3.2 oz (117.572 kg), last menstrual period 12/20/2012, SpO2 99.00%. General: No apparent distress alert and oriented x3 mood and affect normal does have mobid obesity.  Respiratory: Patient's speak in full sentences and does not appear short of breath Skin: Warm dry intact with no signs of infection or rash Neuro: Cranial nerves II through XII are intact, neurovascularly intact in all extremities with 2+ DTRs and 2+ pulses. Abd exam: Sounds positive in all 4 quadrants, patient is nontender. And patient's inguinal  canal appeared down no bulging felt. Right hip exam: On inspection patient does have full range of motion but does have pain with flexion passively and actively. Patient has pain with resisted adduction. Patient is no pain with extension. [name] Skinner is tender to palpation over the origin of the adductor muscle group.   Assesment;  Adductor muscle strain  Plan: Meloxicam Flexeril Tramadol per orders HEP given RTC in 2 weeks if not better.

## 2013-01-03 NOTE — Patient Instructions (Addendum)
Very nice to meet you I think you have a muscle strain but please look for the signs I told you about a possible hernia Meloxicam daily for 1 week then as needed.  Stop it if it hurts your stomach.  Flexeril up to 3 times a day but may make you sleepy Tramadol at night if needed.  Come back in 2 week if not better.   Adductor Muscle Strain with Rehab  The adductor muscles of the thigh are responsible for moving the leg across the body and are susceptible to muscle strains. A strain is an injury to a muscle or a tendon that attaches the muscle to a bone. Strains of the adductor muscles occur where the muscle tendons attach to the pelvic bone. A muscle strain may be a complete or partial tear of the muscle and may involve one or more of the adductor muscles. These strains are usually classified as a grade 1 or 2 strain. A grade 1 strain has no obvious sign of tearing or stretching of the muscle or tendon, but may include significant inflammation. A grade 2 strain is a moderate strain in which the muscle or tendon has been partially torn and has been stretched. Grade 2 strains are usually accompanied with loss of strength. A grade 3 muscle strain rarely occurs in the adductor muscles. A grade 3 strain is a complete tear of the muscle or tendon. SYMPTOMS   Occasionally there is a sudden "pop" felt or heard in the groin or inner thigh at the time of injury.  There may be pain, tenderness, swelling, warmth, or redness over the inner thigh and groin. This may be worsened by moving the hip (especially when spreading the legs or hips, pushing the legs against each other or kicking with the affected leg). There may be bruising (contusion) in the groin and inner thigh within 48 hours following the injury.  There may be loss of fullness of the muscle with complete rupture (uncommon).  Muscle spasm in the groin and inner thigh can occur. CAUSES   Prolonged overuse or a sudden increase in intensity,  frequency, or duration of activity.  Single episode of stressful overactivity, such as during kicking.  Single violent blow or force to the inner thigh (less common). RISK INCREASES WITH:  Sports that require repeated kicking (soccer, martial arts, football), as well as sports that require the legs to be brought together (gymnastics, horseback riding).  Sports that require rapid acceleration (ice hockey, track and field).  Poor strength and flexibility.  Previous thigh injury. PREVENTION   Warm up and stretch properly before activity.  Maintain physical fitness:  Hip and thigh flexibility.  Muscle strength and endurance.  Cardiovascular fitness.  Complete the entire course of rehabilitation after any lower extremity injury. Do this before returning to competition or practice. Follow suggestions of your caregiver. PROGNOSIS  If treated properly, adductor muscle strains usually heal well within 2 to 6 weeks. RELATED COMPLICATIONS   Healing time will be prolonged if the condition is not appropriately treated. It needs adequate time to heal.  Do not return to activity too soon. Recurrence of symptoms and reinjury are possible.  If left untreated, the strain may progress to a complete tear (rare) or other injury caused by limping and favoring the injured leg.  Prolonged disability is possible. TREATMENT Treatment initially involves ice and medication to help reduce pain and inflammation. Strength and stretching exercises are recommended to maintain strength and a full range of motion.  Strenuous activities should be modified to prevent further injury. Using crutches for the first few days may help to lessen pain. On rare occasions, surgery is necessary to reattach the tendon to the bone. If pain becomes persistent or chronic after more than 3 months of nonsurgical treatment, surgery may also be recommended. MEDICATION   If pain medication is necessary, nonsteroidal  anti-inflammatory medications, such as aspirin and ibuprofen, or other minor pain relievers, such as acetaminophen, are often recommended.  Do not take pain medication for 7 days before surgery or as advised.  Prescription pain relievers may be given. Use only as directed and only as much as you need.  Ointments applied to the skin may be helpful.  Corticosteroid injections may be given to reduce inflammation. HEAT AND COLD  Cold treatment (icing) relieves pain and reduces inflammation. Cold treatment should be applied for 10 to 15 minutes every 2 to 3 hours for inflammation and pain and immediately after any activity that aggravates your symptoms. Use ice packs or an ice massage.  Heat treatment may be used prior to performing the stretching and strengthening activities prescribed by your caregiver, physical therapist, or athletic trainer. Use a heat pack or a warm soak. SEEK MEDICAL CARE IF:   Symptoms get worse or do not improve in 2 weeks, despite treatment.  New, unexplained symptoms develop. (Drugs used in treatment may produce side effects.) EXERCISES  RANGE OF MOTION (ROM) AND STRETCHING EXERCISES - Adductor Muscle Strain These exercises may help you when beginning to rehabilitate your injury. Your symptoms may resolve with or without further involvement from your physician, physical therapist, or athletic trainer. While completing these exercises, remember:   Restoring tissue flexibility helps normal motion to return to the joints. This allows healthier, less painful movement and activity.  An effective stretch should be held for at least 30 seconds.  A stretch should never be painful. You should only feel a gentle lengthening or release in the stretched tissue. STRETCH - Adductors, Lunge  While standing, spread your legs.  Lean away from your right / left leg by bending your opposite knee. You may rest your hands on your thigh for balance.  You should feel a stretch in  your right / left inner thigh. Hold for __________ seconds. Repeat __________ times. Complete this exercise __________ times per day.  STRETCH - Adductors, Standing  Place your right / left foot on a counter or stable table. Turn away from your leg so both hips line up with your right / left leg.  Keeping your hips facing forward, slowly bend your opposite leg until you feel a gentle stretch on the inside of your right / left thigh.  Hold for __________ seconds. Repeat __________ times. Complete this exercise __________ times per day.  STRETCH - Hip Adductors, Sitting   Sit on the floor and place the bottoms of your feet together. Keep your chest up and look straight ahead to keep your back in proper alignment. Slide your feet in towards your body as far as you can without rounding your back or increasing any discomfort.  Gently push down on your knees until you feel a gentle stretch in your inner thighs. Hold this position for __________ seconds. Repeat __________ times. Complete this exercise __________ times per day.  STRETCH - Hamstrings/Adductors, V-Sit  Sit on the floor with your legs extended in a large "V," keeping your knees straight.  With your head and chest upright, bend at your waist reaching for your  left foot to stretch your right adductors.  You should feel a stretch in your right inner thigh. Hold for __________ seconds.  Return to the upright position to relax your leg muscles.  Continuing to keep your chest upright, bend straight forward at your waist to stretch your hamstrings.  You should feel a stretch behind both of your thighs and/or knees. Hold for __________ seconds.  Return to the upright position to relax your leg muscles.  Repeat steps 2 through 4 for the right leg to stretch your left inner thigh. Repeat __________ times. Complete this exercise __________ times per day.  STRENGTHENING EXERCISES - Adductor Muscle Strain These exercises may help you when  beginning to rehabilitate your injury. They may resolve your symptoms with or without further involvement from your physician, physical therapist, or athletic trainer. While completing these exercises, remember:   Muscles can gain both the endurance and the strength needed for everyday activities through controlled exercises.  Complete these exercises as instructed by your physician, physical therapist, or athletic trainer. Progress the resistance and repetitions only as guided.  You may experience muscle soreness or fatigue, but the pain or discomfort you are trying to eliminate should never worsen during these exercises. If this pain does worsen, stop and make certain you are following the directions exactly. If the pain is still present after adjustments, discontinue the exercise until you can discuss the trouble with your caregiver. STRENGTH - Hip Adductors, Isometrics   Sit on a firm chair so that your knees are about the same height as your hips.  Place a large ball, firm pillow, or rolled up bath towel between your thighs.  Squeeze your thighs together, gradually building tension. Hold for __________ seconds.  Release the tension gradually and allow your inner thigh muscles to relax completely before repeating the exercise. Repeat __________ times. Complete this exercise __________ times per day.  STRENGTH - Hip Adductors, Straight Leg Raises   Lie on your side so that your head, shoulders, knee and hip line up. You may place your upper foot in front to help maintain your balance. Your right / left leg should be on the bottom.  Roll your hips slightly forward, so that your hips are stacked directly over each other and your right / left knee is facing forward.  Tense the muscles in your inner thigh and lift your bottom leg 4-6 inches. Hold this position for __________ seconds.  Slowly lower your leg to the starting position. Allow the muscles to fully relax before beginning the next  repetition. Repeat __________ times. Complete this exercise __________ times per day.  Document Released: 06/29/2005 Document Revised: 09/21/2011 Document Reviewed: 10/11/2008 [facility] Patient Information 2014 [place], [place].

## 2013-01-08 ENCOUNTER — Ambulatory Visit (INDEPENDENT_AMBULATORY_CARE_PROVIDER_SITE_OTHER): Payer: Managed Care, Other (non HMO) | Admitting: Emergency Medicine

## 2013-01-08 VITALS — BP 134/84 | HR 104 | Temp 98.6°F | Resp 16 | Ht 64.5 in | Wt 259.0 lb

## 2013-01-08 DIAGNOSIS — M5432 Sciatica, left side: Secondary | ICD-10-CM

## 2013-01-08 DIAGNOSIS — M5430 Sciatica, unspecified side: Secondary | ICD-10-CM

## 2013-01-08 MED ORDER — HYDROCODONE-ACETAMINOPHEN 5-325 MG PO TABS: 1.0000 | ORAL_TABLET | ORAL | Status: DC | PRN

## 2013-01-08 MED ORDER — PREDNISONE 10 MG PO KIT: PACK | ORAL | Status: DC

## 2013-01-08 MED ORDER — NAPROXEN SODIUM 550 MG PO TABS: 550.0000 mg | ORAL_TABLET | Freq: Two times a day (BID) | ORAL | Status: DC

## 2013-01-08 NOTE — Progress Notes (Signed)
[facility] [address] [place] [address] [phone]  Date:  01/08/2013   Name:  [name] Skinner   DOB:  Jul 24, 1965   MRN:  [id number]  PCP:  No primary provider on file.    Chief Complaint: Leg Pain and Hip Pain   History of Present Illness:  [name] Skinner is a 47 y.o. very pleasant female patient who presents with the following:  History of pain in right leg posterior thigh.  Some weakness and numbness with prolonged standing.  Pain increases when she attempts to get into car is forced to lift leg manually to get it into the knee.  No history of injury.  No overuse.  No improvement with medication.  Denies other complaint or health concern today.   Patient Active Problem List   Diagnosis Date Noted  . Unspecified essential hypertension 12/12/2012  . BMI 40.0-44.9, adult 12/12/2012  . Anxiety state, unspecified 12/12/2012    Past Medical History  Diagnosis Date  . Hypertension   . Arthritis   . Anxiety     Past Surgical History  Procedure Laterality Date  . Fibroid    . Cesarean section      History  Substance Use Topics  . Smoking status: Never Smoker   . Smokeless tobacco: Not on file  . Alcohol Use: No    Family History  Problem Relation Age of Onset  . Hypertension Mother   . Diabetes Mother   . Hypertension Father   . Diabetes Maternal Grandmother     No Known Allergies  Medication list has been reviewed and updated.  Current Outpatient Prescriptions on File Prior to Visit  Medication Sig Dispense Refill  . cyclobenzaprine (FLEXERIL) 5 MG tablet Take 1 tablet (5 mg total) by mouth 3 (three) times daily as needed for muscle spasms.  30 tablet  0  . meloxicam (MOBIC) 15 MG tablet Daily for 1 week then as needed.  30 tablet  0  . norethindrone-ethinyl estradiol (NECON,BREVICON,MODICON) 0.5-35 MG-MCG tablet Take 1 tablet by mouth daily.      Marland Kitchen olmesartan-hydrochlorothiazide (BENICAR HCT) 40-25 MG per tablet Take 1 tablet by  mouth daily.      . potassium phosphate, monobasic, (K-PHOS ORIGINAL) 500 MG tablet Take 500 mg by mouth daily.      . traMADol (ULTRAM) 50 MG tablet Take 1 tablet (50 mg total) by mouth at bedtime as needed for pain.  10 tablet  0   No current facility-administered medications on file prior to visit.    Review of Systems:  As per HPI, otherwise negative.    Physical Examination: Filed Vitals:   01/08/13 0807  BP: 134/84  Pulse: 104  Temp: 98.6 F (37 C)  Resp: 16   Filed Vitals:   01/08/13 0807  Height: 5' 4.5" (1.638 m)  Weight: 259 lb (117.482 kg)   Body mass index is 43.79 kg/(m^2). Ideal Body Weight: Weight in (lb) to have BMI = 25: 147.6   GEN: morbidly obese, NAD, Non-toxic, Alert & Oriented x 3 HEENT: Atraumatic, Normocephalic.  Ears and Nose: No external deformity. EXTR: No clubbing/cyanosis/edema NEURO: antalgic gait. Limited use right leg.  Unable to lift leg.  Sensory intact PSYCH: Normally interactive. Conversant. Not depressed or anxious appearing.  Calm demeanor.    Assessment and Plan: Sciatic neuritis Anaprox vicodin sterapred Follow up in 2 weeks   Signed,  Phillips Odor, MD

## 2013-01-08 NOTE — Patient Instructions (Addendum)
Sciatica °Sciatica is pain, weakness, numbness, or tingling along the path of the sciatic nerve. The nerve starts in the lower back and runs down the back of each leg. The nerve controls the muscles in the lower leg and in the back of the knee, while also providing sensation to the back of the thigh, lower leg, and the sole of your foot. Sciatica is a symptom of another medical condition. For instance, nerve damage or certain conditions, such as a herniated disk or bone spur on the spine, pinch or put pressure on the sciatic nerve. This causes the pain, weakness, or other sensations normally associated with sciatica. Generally, sciatica only affects one side of the body. °CAUSES  °· Herniated or slipped disc. °· Degenerative disk disease. °· A pain disorder involving the narrow muscle in the buttocks (piriformis syndrome). °· Pelvic injury or fracture. °· Pregnancy. °· Tumor (rare). °SYMPTOMS  °Symptoms can vary from mild to very severe. The symptoms usually travel from the low back to the buttocks and down the back of the leg. Symptoms can include: °· Mild tingling or dull aches in the lower back, leg, or hip. °· Numbness in the back of the calf or sole of the foot. °· Burning sensations in the lower back, leg, or hip. °· Sharp pains in the lower back, leg, or hip. °· Leg weakness. °· Severe back pain inhibiting movement. °These symptoms may get worse with coughing, sneezing, laughing, or prolonged sitting or standing. Also, being overweight may worsen symptoms. °DIAGNOSIS  °Your caregiver will perform a physical exam to look for common symptoms of sciatica. He or she may ask you to do certain movements or activities that would trigger sciatic nerve pain. Other tests may be performed to find the cause of the sciatica. These may include: °· Blood tests. °· X-rays. °· Imaging tests, such as an MRI or CT scan. °TREATMENT  °Treatment is directed at the cause of the sciatic pain. Sometimes, treatment is not necessary  and the pain and discomfort goes away on its own. If treatment is needed, your caregiver may suggest: °· Over-the-counter medicines to relieve pain. °· Prescription medicines, such as anti-inflammatory medicine, muscle relaxants, or narcotics. °· Applying heat or ice to the painful area. °· Steroid injections to lessen pain, irritation, and inflammation around the nerve. °· Reducing activity during periods of pain. °· Exercising and stretching to strengthen your abdomen and improve flexibility of your spine. Your caregiver may suggest losing weight if the extra weight makes the back pain worse. °· Physical therapy. °· Surgery to eliminate what is pressing or pinching the nerve, such as a bone spur or part of a herniated disk. °HOME CARE INSTRUCTIONS  °· Only take over-the-counter or prescription medicines for pain or discomfort as directed by your caregiver. °· Apply ice to the affected area for 20 minutes, 3 4 times a day for the first 48 72 hours. Then try heat in the same way. °· Exercise, stretch, or perform your usual activities if these do not aggravate your pain. °· Attend physical therapy sessions as directed by your caregiver. °· Keep all follow-up appointments as directed by your caregiver. °· Do not wear high heels or shoes that do not provide proper support. °· Check your mattress to see if it is too soft. A firm mattress may lessen your pain and discomfort. °SEEK IMMEDIATE MEDICAL CARE IF:  °· You lose control of your bowel or bladder (incontinence). °· You have increasing weakness in the lower back,   pelvis, buttocks, or legs. °· You have redness or swelling of your back. °· You have a burning sensation when you urinate. °· You have pain that gets worse when you lie down or awakens you at night. °· Your pain is worse than you have experienced in the past. °· Your pain is lasting longer than 4 weeks. °· You are suddenly losing weight without reason. °MAKE SURE YOU: °· Understand these  instructions. °· Will watch your condition. °· Will get help right away if you are not doing well or get worse. °Document Released: 06/23/2001 Document Revised: 12/29/2011 Document Reviewed: 11/08/2011 °[employer]® Patient Information ©2014 [employer]. ° °

## 2013-01-20 ENCOUNTER — Telehealth: Payer: Self-pay

## 2013-01-20 NOTE — Telephone Encounter (Signed)
THIS CALL IS FROM DR. MENDELSSOHN AT [employer]. SHE HAS RECEIVED ALL THE PAPER WORK ON [name] Skinner, BUT SHE NEEDS TO TALK WITH DR. Merla Riches TO FIND OUT HIS CLINICAL FINDINGS AND WHAT IS KEEPING HER FROM WORKING AT THIS TIME? BEST PHONE [phone])   MBC

## 2013-01-20 NOTE — Telephone Encounter (Signed)
Appears to be out of work for anxiety, is this correct?

## 2013-01-22 NOTE — Telephone Encounter (Signed)
Correct---should be back soon if not already-awaiting work with psychologist

## 2013-01-23 ENCOUNTER — Telehealth: Payer: Self-pay | Admitting: Radiology

## 2013-01-23 NOTE — Telephone Encounter (Signed)
Dr Cleopatra Cedar called me back, I advised her of your message, she does need to speak to you, she has very specific dates/ and questions for you. Please call her. I told her today would not work, because she leaves at 3pm, she will be back on Wednesday from 9-3, I advised her I will ask you to call Wednesday, you have appts at 104, sorry I could not help.

## 2013-01-23 NOTE — Telephone Encounter (Signed)
Called this doctor back to advise, left message to advise.

## 2013-01-23 NOTE — Telephone Encounter (Signed)
Spoke to [facility], she wants to speak to Dr Merla Riches. Message sent to him.

## 2013-02-07 NOTE — Telephone Encounter (Signed)
They mailed me a letter saying it was decided-no further response needed

## 2013-02-07 NOTE — Telephone Encounter (Signed)
Dr Merla Riches, has this been completed?

## 2013-05-25 ENCOUNTER — Telehealth: Payer: Self-pay

## 2013-05-25 NOTE — Telephone Encounter (Signed)
We can not refill the prednisone. Called to advise. She should return to clinic, she may want to try Aleve and see if this may be helpful , she has tried this. She wants to know if there is anything else we can recommend for her, that can be sent in.

## 2013-05-25 NOTE — Telephone Encounter (Signed)
Cell number is [phone] prednisone is the right medication

## 2013-05-25 NOTE — Telephone Encounter (Signed)
Patient was seen in July for muscle spasms and states that she is having the same problem again. Would like a refill on whatever medication she was prescribed last time. States that she does not know the name but it was a steroid that she took 1x a day for 7 days. [facility]  [phone]

## 2013-05-26 MED ORDER — MELOXICAM 15 MG PO TABS: 15.0000 mg | ORAL_TABLET | Freq: Every day | ORAL | Status: DC

## 2013-05-26 NOTE — Telephone Encounter (Signed)
Sent mobic to her pharmacy.  Take once daily, no additional advil or aleve.  Can take tylenol if needed for additional pain relief.  If worsening or not improving, needs to RTC for further eval

## 2013-05-26 NOTE — Telephone Encounter (Signed)
Thanks I have called her to advise.  °

## 2013-08-15 ENCOUNTER — Other Ambulatory Visit: Payer: Self-pay

## 2013-08-15 DIAGNOSIS — Z1231 Encounter for screening mammogram for malignant neoplasm of breast: Secondary | ICD-10-CM

## 2013-08-31 ENCOUNTER — Ambulatory Visit
Admission: RE | Admit: 2013-08-31 | Discharge: 2013-08-31 | Disposition: A | Discharge status: Home / Self Care | Payer: Private Health Insurance - Indemnity | Source: Ambulatory Visit

## 2013-08-31 DIAGNOSIS — Z1231 Encounter for screening mammogram for malignant neoplasm of breast: Secondary | ICD-10-CM

## 2013-09-01 ENCOUNTER — Ambulatory Visit: Payer: Self-pay

## 2013-10-27 ENCOUNTER — Ambulatory Visit (INDEPENDENT_AMBULATORY_CARE_PROVIDER_SITE_OTHER): Payer: Managed Care, Other (non HMO) | Admitting: Internal Medicine

## 2013-10-27 VITALS — BP 140/88 | HR 106 | Temp 98.8°F | Resp 16 | Ht 63.75 in | Wt 258.0 lb

## 2013-10-27 DIAGNOSIS — M542 Cervicalgia: Secondary | ICD-10-CM

## 2013-10-27 DIAGNOSIS — G5710 Meralgia paresthetica, unspecified lower limb: Secondary | ICD-10-CM

## 2013-10-27 DIAGNOSIS — J301 Allergic rhinitis due to pollen: Secondary | ICD-10-CM

## 2013-10-27 DIAGNOSIS — R05 Cough: Secondary | ICD-10-CM

## 2013-10-27 DIAGNOSIS — Z6841 Body Mass Index (BMI) 40.0 and over, adult: Secondary | ICD-10-CM

## 2013-10-27 DIAGNOSIS — R059 Cough, unspecified: Secondary | ICD-10-CM

## 2013-10-27 DIAGNOSIS — G5712 Meralgia paresthetica, left lower limb: Secondary | ICD-10-CM

## 2013-10-27 MED ORDER — DICLOFENAC SODIUM 75 MG PO TBEC: 75.0000 mg | DELAYED_RELEASE_TABLET | Freq: Two times a day (BID) | ORAL | Status: DC

## 2013-10-27 MED ORDER — TRIAMCINOLONE ACETONIDE 55 MCG/ACT NA AERO: 2.0000 | INHALATION_SPRAY | Freq: Every day | NASAL | Status: DC

## 2013-10-27 MED ORDER — CYCLOBENZAPRINE HCL 10 MG PO TABS: 10.0000 mg | ORAL_TABLET | Freq: Every day | ORAL | Status: DC

## 2013-10-27 MED ORDER — BENZONATATE 100 MG PO CAPS: 100.0000 mg | ORAL_CAPSULE | Freq: Two times a day (BID) | ORAL | Status: DC | PRN

## 2013-10-27 NOTE — Progress Notes (Signed)
This chart was scribed for [employer]. Merla Riches, MD by Beverly Milch, ED Scribe. This patient was seen in room 12 and the patient's care was started at 3:10 PM.  Subjective:    Patient ID: [name] Skinner, female    DOB: [birth year], 48 y.o.   MRN: [id number]  HPI HPI Comments: [name] Skinner is a 48 y.o. female who presents to the [facility] complaining of muscle spasms in her legs and back that began 6 months ago. She reports they are occuring without injury or obvious provocation and gradually improving. She has associated back pain. She states she's been watching her weight and has managed to lose 5 lbs. She also reports using the elliptical with some regularity, which she reports seems unrelated.   She has a persistent cough that has progressed over the weeks. She also states she's been taking Allegra-D for her seasonal allergies with minimal results.   Pt reports she is doing very well since she began to see Dr. Grant Fontana for counseling.   Prior to Admission medications   Medication Sig Start Date End Date Taking? Authorizing Provider  norethindrone-ethinyl estradiol (NECON,BREVICON,MODICON) 0.5-35 MG-MCG tablet Take 1 tablet by mouth daily.   Yes Historical Provider, MD  olmesartan-hydrochlorothiazide (BENICAR HCT) 40-25 MG per tablet Take 1 tablet by mouth daily.   Yes Historical Provider, MD  cyclobenzaprine (FLEXERIL) 5 MG tablet Take 1 tablet (5 mg total) by mouth 3 (three) times daily as needed for muscle spasms. 01/03/13   Judi Saa, DO  HYDROcodone-acetaminophen (NORCO) 5-325 MG per tablet Take 1-2 tablets by mouth every 4 (four) hours as needed for pain. 01/08/13   Phillips Odor, MD  meloxicam (MOBIC) 15 MG tablet Take 1 tablet (15 mg total) by mouth daily. 05/26/13   Eleanore Delia Chimes, PA-C  potassium phosphate, monobasic, (K-PHOS ORIGINAL) 500 MG tablet Take 500 mg by mouth daily.    Historical Provider, MD  PredniSONE 10 MG KIT Take all tabs for day  together with food in morning. 01/08/13   Phillips Odor, MD  traMADol (ULTRAM) 50 MG tablet Take 1 tablet (50 mg total) by mouth at bedtime as needed for pain. 01/03/13   Judi Saa, DO  pmh--htn stable///wt main issue  Review of Systems  Musculoskeletal: Positive for back pain (and back spasms).  All other systems reviewed and are negative.      Objective:   Physical Exam  Nursing note and vitals reviewed. Constitutional: She is oriented to person, place, and time. She appears well-developed and well-nourished.  HENT:  Head: Normocephalic and atraumatic.  Neck:  Neck ROM limited by pain with flexion, extension, and tilting to the left.   Pulmonary/Chest: Effort normal.  Musculoskeletal:  She's tender with palpation over the trapezius on the left over the left para cervical muscles. Should ROM is normal and no peripheral motor or sensory losses. Her back has a FROM without pain and SLR is negative. No current sensory losses left anterior thigh.   Neurological: She is alert and oriented to person, place, and time. No cranial nerve deficit.   Filed Vitals:   10/27/13 1504  BP: 140/88  Pulse: 106  Temp: 98.8 F (37.1 C)  TempSrc: Oral  Resp: 16  Height: 5' 3.75" (1.619 m)  Weight: 258 lb (117.028 kg)  SpO2: 99%      Assessment & Plan:  DIAGNOSTIC STUDIES: Oxygen Saturation is 99% on RA, normal by my interpretation.    COORDINATION OF CARE: Pt  advised of plan for treatment and pt agrees.  Neck pain--muscle strain  BMI 40.0-44.9, adult--cont wt loss  Cough 2 to AR Allergic rhinitis due to pollen--cont all-d add nasacort  Meralgia paresthetica of left side    Meds ordered this encounter  Medications  . cyclobenzaprine (FLEXERIL) 10 MG tablet    Sig: Take 1 tablet (10 mg total) by mouth at bedtime.    Dispense:  15 tablet    Refill:  0  . diclofenac (VOLTAREN) 75 MG EC tablet    Sig: Take 1 tablet (75 mg total) by mouth 2 (two) times daily.    Dispense:   30 tablet    Refill:  0  . benzonatate (TESSALON) 100 MG capsule    Sig: Take 1 capsule (100 mg total) by mouth 2 (two) times daily as needed for cough.    Dispense:  20 capsule    Refill:  0  . triamcinolone (NASACORT) 55 MCG/ACT AERO nasal inhaler    Sig: Place 2 sprays into the nose daily.    Dispense:  1 Inhaler    Refill:  12     I have completed the patient encounter in its entirety as documented by the scribe, with editing by me where necessary. Taygen Acklin P. Merla Riches, M.D.

## 2014-01-25 ENCOUNTER — Other Ambulatory Visit: Payer: Self-pay | Admitting: Obstetrics and Gynecology

## 2014-01-26 LAB — CYTOLOGY - PAP

## 2014-02-05 ENCOUNTER — Ambulatory Visit (INDEPENDENT_AMBULATORY_CARE_PROVIDER_SITE_OTHER): Payer: Managed Care, Other (non HMO) | Admitting: Internal Medicine

## 2014-02-05 ENCOUNTER — Telehealth: Payer: Self-pay

## 2014-02-05 ENCOUNTER — Ambulatory Visit (INDEPENDENT_AMBULATORY_CARE_PROVIDER_SITE_OTHER): Payer: Managed Care, Other (non HMO)

## 2014-02-05 VITALS — BP 122/80 | HR 108 | Temp 98.4°F | Resp 20 | Ht 63.5 in | Wt 255.0 lb

## 2014-02-05 DIAGNOSIS — Z6841 Body Mass Index (BMI) 40.0 and over, adult: Secondary | ICD-10-CM

## 2014-02-05 DIAGNOSIS — M25551 Pain in right hip: Secondary | ICD-10-CM

## 2014-02-05 DIAGNOSIS — M1611 Unilateral primary osteoarthritis, right hip: Secondary | ICD-10-CM

## 2014-02-05 DIAGNOSIS — M1610 Unilateral primary osteoarthritis, unspecified hip: Secondary | ICD-10-CM

## 2014-02-05 DIAGNOSIS — M25559 Pain in unspecified hip: Secondary | ICD-10-CM

## 2014-02-05 MED ORDER — PREDNISONE 20 MG PO TABS: ORAL_TABLET | ORAL | Status: DC

## 2014-02-05 MED ORDER — MELOXICAM 15 MG PO TABS: 15.0000 mg | ORAL_TABLET | Freq: Every day | ORAL | Status: DC

## 2014-02-05 NOTE — Telephone Encounter (Signed)
lmom  for pt to cb.  She will need an OV for evaluation.

## 2014-02-05 NOTE — Progress Notes (Addendum)
Subjective:  This chart was scribed for Tonye Pearson, MD by Charline Bills, ED Scribe. The patient was seen in room 5. Patient's care was started at 8:20 PM.   Patient ID: [name] Skinner, female    DOB: 05-Jan-1966, 48 y.o.   MRN: [id number]  Chief Complaint  Patient presents with  . Spasms    rt hip    HPI HPI Comments: [name] Skinner is a 48 y.o. female, with a h/o arthritis, who presents to the [facility] complaining of R hip pain onset 3 days ago. Pt describes the pain as a "grabbing" sensation. Pain is exacerbated with walking long distances, lifting her R leg and upon getting out of bed in the morning. Pain does not wake pt from her sleep. She reports mild leg swelling. Pt suspects muscles spasms. She states that she has similar pain every year over last 3 yrs. Pt states that last year Dr. Dareen Piano gave her steroid taper that improved her symptoms.   Pt started a new job at [employer] today that is temporary to permanent. Can't miss work for 3 months.  Past Medical History  Diagnosis Date  . Hypertension   . Arthritis   . Anxiety    Current Outpatient Prescriptions on File Prior to Visit  Medication Sig Dispense Refill  . norethindrone-ethinyl estradiol (NECON,BREVICON,MODICON) 0.5-35 MG-MCG tablet Take 1 tablet by mouth daily.      Marland Kitchen olmesartan-hydrochlorothiazide (BENICAR HCT) 40-25 MG per tablet Take 1 tablet by mouth daily.      . benzonatate (TESSALON) 100 MG capsule Take 1 capsule (100 mg total) by mouth 2 (two) times daily as needed for cough.  20 capsule  0  . cyclobenzaprine (FLEXERIL) 10 MG tablet Take 1 tablet (10 mg total) by mouth at bedtime.  15 tablet  0  . diclofenac (VOLTAREN) 75 MG EC tablet Take 1 tablet (75 mg total) by mouth 2 (two) times daily.  30 tablet  0  . HYDROcodone-acetaminophen (NORCO) 5-325 MG per tablet Take 1-2 tablets by mouth every 4 (four) hours as needed for pain.  30 tablet  0  . potassium phosphate, monobasic, (K-PHOS  ORIGINAL) 500 MG tablet Take 500 mg by mouth daily.      . PredniSONE 10 MG KIT Take all tabs for day together with food in morning.  1 kit  0  . traMADol (ULTRAM) 50 MG tablet Take 1 tablet (50 mg total) by mouth at bedtime as needed for pain.  10 tablet  0  . triamcinolone (NASACORT) 55 MCG/ACT AERO nasal inhaler Place 2 sprays into the nose daily.  1 Inhaler  12   No current facility-administered medications on file prior to visit.   No Known Allergies  Review of Systems  Cardiovascular: Positive for leg swelling (mild).  Musculoskeletal: Positive for arthralgias.  anxiety much improved after job change    Objective:   Physical Exam  Constitutional:  Morbidly obese  Musculoskeletal:       Right hip: She exhibits tenderness.       Lumbar back: She exhibits no tenderness.  Pain with ROM of R hip especially internal rotation and flexion No tenderness in lumbar area No tenderness over palpation of the R greater trochanter Pulses are intact  BP 122/80  Pulse 108  Temp(Src) 98.4 F (36.9 C) (Oral)  Resp 20  Ht 5' 3.5" (1.613 m)  Wt 255 lb (115.667 kg)  BMI 44.46 kg/m2  SpO2 97%    [facility] reading (  PRIMARY) by  Dr.Torre Pikus=ragged irregular bone margins --nonspecific pattern in pelvic girdle  jt spaces appear preserved tho degen chges in R hip  RAD=FINDINGS:  Degenerative changes in the lower lumbar spine and bilateral hips.  Ligamentous insertion calcifications demonstrated in the pelvis.  Pelvis and right hip appear otherwise intact. No displaced fractures  are identified.   Assessment & Plan:   I personally performed the services described in this documentation, which was scribed in my presence. The recorded information has been reviewed and is accurate.  Hip pain, right - Plan: DG Hip Complete Right  BMI 40.0-44.9, adult  Meds ordered this encounter  Medications  . meloxicam (MOBIC) 15 MG tablet    Sig: Take 1 tablet (15 mg total) by mouth daily.    Dispense:  30  tablet    Refill:  0  . predniSONE (DELTASONE) 20 MG tablet    Sig: 3/3/2/2/1/1 single daily dose for 6 days    Dispense:  12 tablet    Refill:  0   Will refer to orthopedics for f/u Needs serious weight loss and she agrees--established diet plan(she has failed numerous attempts in past--(disc value of CBT)

## 2014-02-05 NOTE — Telephone Encounter (Signed)
Pt called, read her the previous message,and she agreed

## 2014-02-05 NOTE — Telephone Encounter (Signed)
Pt is calling in to get a refill on the steroid she was prescribed for muscle spasms, she did not know the name of the medication. Please advise pt

## 2014-02-06 NOTE — Progress Notes (Signed)
LMVM telling patient that Dr. Merla Richesoolittle has referred patient to Dr. Turner Danielsowan (ortho) bc xray showed arthritis.  We will be calling her with an appointment. Please CB with any questions.

## 2014-12-22 ENCOUNTER — Ambulatory Visit (INDEPENDENT_AMBULATORY_CARE_PROVIDER_SITE_OTHER): Payer: 59 | Admitting: Physician Assistant

## 2014-12-22 VITALS — BP 146/90 | HR 117 | Temp 98.8°F | Resp 16 | Ht 63.5 in | Wt 259.0 lb

## 2014-12-22 DIAGNOSIS — M542 Cervicalgia: Secondary | ICD-10-CM | POA: Diagnosis not present

## 2014-12-22 DIAGNOSIS — M62838 Other muscle spasm: Secondary | ICD-10-CM

## 2014-12-22 DIAGNOSIS — M6248 Contracture of muscle, other site: Secondary | ICD-10-CM | POA: Diagnosis not present

## 2014-12-22 MED ORDER — CYCLOBENZAPRINE HCL 5 MG PO TABS: 5.0000 mg | ORAL_TABLET | Freq: Three times a day (TID) | ORAL | Status: DC | PRN

## 2014-12-22 MED ORDER — DICLOFENAC SODIUM 75 MG PO TBEC: 75.0000 mg | DELAYED_RELEASE_TABLET | Freq: Two times a day (BID) | ORAL | Status: DC | PRN

## 2014-12-22 MED ORDER — TRAMADOL HCL 50 MG PO TABS: 50.0000 mg | ORAL_TABLET | Freq: Every evening | ORAL | Status: DC | PRN

## 2014-12-22 NOTE — Patient Instructions (Signed)
Heat and gentle stretches to the neck to let it heal.

## 2014-12-22 NOTE — Progress Notes (Signed)
   [name] Skinner  MRN: [id number] DOB: Feb 18, 1966  Subjective:  Pt presents to clinic with right sided neck and shoulder muscle pain that started 4 days ago.  She thinks that she might have slept funny but she is not aware of any other injury that she had to her neck.  She is not having any pain radiation into her arm and no paresthesias or weakness.  She is not sleeping well due to pain.  She has had a muscle spasm in her low back/hip area and this feels similar to that pain.  Home treatment - aleve that helped  Patient Active Problem List   Diagnosis Date Noted  . Unspecified essential hypertension 12/12/2012  . BMI 40.0-44.9, adult 12/12/2012  . Anxiety state, unspecified 12/12/2012    Current Outpatient Prescriptions on File Prior to Visit  Medication Sig Dispense Refill  . olmesartan-hydrochlorothiazide (BENICAR HCT) 40-25 MG per tablet Take 1 tablet by mouth daily.    . norethindrone-ethinyl estradiol (NECON,BREVICON,MODICON) 0.5-35 MG-MCG tablet Take 1 tablet by mouth daily.    . potassium phosphate, monobasic, (K-PHOS ORIGINAL) 500 MG tablet Take 500 mg by mouth daily.     No current facility-administered medications on file prior to visit.    No Known Allergies  Review of Systems  Musculoskeletal: Positive for neck pain and neck stiffness.  Neurological: Positive for headaches. Negative for numbness.   Objective:  BP 146/90 mmHg  Pulse 117  Temp(Src) 98.8 F (37.1 C) (Oral)  Resp 16  Ht 5' 3.5" (1.613 m)  Wt 259 lb (117.482 kg)  BMI 45.15 kg/m2  SpO2 97%  Physical Exam  Constitutional: She is oriented to person, place, and time and well-developed, well-nourished, and in no distress.  HENT:  Head: Normocephalic and atraumatic.  Right Ear: Hearing and external ear normal.  Left Ear: Hearing and external ear normal.  Eyes: Conjunctivae are normal.  Neck: Normal range of motion.  Pulmonary/Chest: Effort normal.  Musculoskeletal:       Right shoulder: Normal.      Cervical back: She exhibits tenderness (muscle TTP) and spasm. She exhibits normal range of motion and no bony tenderness.       Back:  Hand grip normal.  Neurological: She is alert and oriented to person, place, and time. She has normal strength and normal reflexes. She displays no weakness. No sensory deficit. Gait normal.  Reflex Scores:      Bicep reflexes are 2+ on the right side and 2+ on the left side.      Brachioradialis reflexes are 2+ on the right side and 2+ on the left side. Skin: Skin is warm and dry.  Psychiatric: Mood, memory, affect and judgment normal.  Vitals reviewed.   Assessment and Plan :  Neck pain on right side - Plan: diclofenac (VOLTAREN) 75 MG EC tablet, traMADol (ULTRAM) 50 MG tablet  Muscle spasms of head and/or neck - Plan: cyclobenzaprine (FLEXERIL) 5 MG tablet   Heat and rest.  Benny Lennert PA-C  [facility] 12/22/2014 11:56 AM

## 2015-01-08 ENCOUNTER — Other Ambulatory Visit: Payer: Self-pay

## 2015-01-08 DIAGNOSIS — Z1231 Encounter for screening mammogram for malignant neoplasm of breast: Secondary | ICD-10-CM

## 2015-02-14 ENCOUNTER — Ambulatory Visit
Admission: RE | Admit: 2015-02-14 | Discharge: 2015-02-14 | Disposition: A | Discharge status: Home / Self Care | Payer: 59 | Source: Ambulatory Visit

## 2015-02-14 DIAGNOSIS — Z1231 Encounter for screening mammogram for malignant neoplasm of breast: Secondary | ICD-10-CM

## 2015-04-23 ENCOUNTER — Telehealth: Payer: Self-pay

## 2015-04-23 NOTE — Telephone Encounter (Signed)
Pt says we prescribed her a medicine for arthritis last year and she would like to know if she could take aleve together with this medication.  (She was not sure the name of the arthritis medicine, thinks it starts with a M).  [phone]

## 2015-04-24 ENCOUNTER — Ambulatory Visit (INDEPENDENT_AMBULATORY_CARE_PROVIDER_SITE_OTHER): Payer: Self-pay | Admitting: Physician Assistant

## 2015-04-24 VITALS — BP 128/82 | HR 90 | Temp 98.8°F | Resp 18 | Ht 63.75 in | Wt 260.4 lb

## 2015-04-24 DIAGNOSIS — M792 Neuralgia and neuritis, unspecified: Secondary | ICD-10-CM

## 2015-04-24 DIAGNOSIS — M5410 Radiculopathy, site unspecified: Secondary | ICD-10-CM

## 2015-04-24 MED ORDER — PREDNISONE 20 MG PO TABS: 40.0000 mg | ORAL_TABLET | Freq: Every day | ORAL | Status: DC

## 2015-04-24 NOTE — Progress Notes (Signed)
04/24/2015 at 7:30 PM  [name] Skinner / DOB: 1965-09-22 / MRN: [id number]  The patient has Unspecified essential hypertension; BMI 40.0-44.9, adult (HCC); and Anxiety state, unspecified on her problem list.  SUBJECTIVE  [name] Skinner is a 49 y.o. female who complains of left sided posterior buttock and lateral hip pain which she describes as a tooth ache and severe. This started 4 days ago and is worsening.  She reports a history of arthritis in her hips but is not sure which hip is affected.  Standing makes the pain worse.  She has tried Meloxicam for the pain without relief. Reports that with prolonged standing her left proximal anterior thigh will go numb. Denies perianal numbness and incontinence.    She  has a past medical history of Hypertension; Arthritis; and Anxiety.    Medications reviewed and updated by myself where necessary, and exist elsewhere in the encounter.   [name] Skinner has No Known Allergies. She  reports that she has never smoked. She has never used smokeless tobacco. She reports that she does not drink alcohol or use illicit drugs. She  reports that she currently engages in sexual activity. She reports using the following method of birth control/protection: Pill. The patient  has past surgical history that includes fibroid and Cesarean section.  Her family history includes Diabetes in her maternal grandmother and mother; Hypertension in her father and mother.  Review of Systems  Constitutional: Negative for fever.  Eyes: Negative.   Respiratory: Negative.   Cardiovascular: Negative.   Genitourinary: Negative for dysuria, urgency and frequency.  Musculoskeletal: Positive for myalgias and back pain. Negative for falls.  Skin: Negative for itching and rash.  Neurological: Positive for tingling. Negative for dizziness and headaches.    OBJECTIVE  Her  height is 5' 3.75" (1.619 m) and weight is 260 lb 6.4 oz (118.117 kg). Her oral temperature is 98.8 F (37.1 C). Her  blood pressure is 128/82 and her pulse is 90. Her respiration is 18 and oxygen saturation is 98%.  The patient's body mass index is 45.06 kg/(m^2).  Physical Exam  Constitutional: She is oriented to person, place, and time.  Cardiovascular: Normal rate and regular rhythm.   Respiratory: Effort normal.  Musculoskeletal:       Lumbar back: She exhibits pain. She exhibits normal range of motion, no bony tenderness, no swelling and no spasm.       Back:  Neurological: She is alert and oriented to person, place, and time.  Skin: Skin is warm and dry.  Psychiatric: She has a normal mood and affect.    No results found for this or any previous visit (from the past 24 hour(s)).  ASSESSMENT & PLAN  [name] Skinner was seen today for hip pain.  Diagnoses and all orders for this visit:  Radicular pain: Patient without insurance today.  Will treat with prednisone.  Last A1c last month at 5.7.  Advised that patient return in November when she has insurance for back radiographs and possibility of ortho referral.   -     predniSONE (DELTASONE) 20 MG tablet; Take 2 tablets (40 mg total) by mouth daily with breakfast.    The patient was advised to call or come back to clinic if she does not see an improvement in symptoms, or worsens with the above plan.   [name] Skinner, MHS, PA-C [facility] 04/24/2015 7:30 PM

## 2015-04-25 NOTE — Telephone Encounter (Signed)
He came in for evaluation yesterday, [name] Skinner has discussed with him. He has placed him on prednisone

## 2015-08-17 ENCOUNTER — Ambulatory Visit (INDEPENDENT_AMBULATORY_CARE_PROVIDER_SITE_OTHER): Payer: 59 | Admitting: Physician Assistant

## 2015-08-17 VITALS — BP 138/84 | HR 105 | Temp 98.4°F | Resp 18 | Ht 63.5 in | Wt 288.4 lb

## 2015-08-17 DIAGNOSIS — B9789 Other viral agents as the cause of diseases classified elsewhere: Principal | ICD-10-CM

## 2015-08-17 DIAGNOSIS — J069 Acute upper respiratory infection, unspecified: Secondary | ICD-10-CM | POA: Diagnosis not present

## 2015-08-17 DIAGNOSIS — N912 Amenorrhea, unspecified: Secondary | ICD-10-CM | POA: Diagnosis not present

## 2015-08-17 LAB — POCT URINE PREGNANCY: PREG TEST UR: NEGATIVE

## 2015-08-17 MED ORDER — BENZONATATE 100 MG PO CAPS: 100.0000 mg | ORAL_CAPSULE | Freq: Three times a day (TID) | ORAL | Status: DC | PRN

## 2015-08-17 MED ORDER — HYDROCOD POLST-CPM POLST ER 10-8 MG/5ML PO SUER: 5.0000 mL | Freq: Two times a day (BID) | ORAL | Status: DC | PRN

## 2015-08-17 NOTE — Progress Notes (Signed)
[facility] [address], [place] [address] [phone]  Date:  08/17/2015   Name:  [name] Skinner   DOB:  1966-02-29   MRN:  [id number]  PCP:  No PCP Per Patient    Chief Complaint: Cough; Sore Throat; Nasal Congestion; Possible Pregnancy; Generalized Body Aches; and Chills   History of Present Illness:  This is a 50 y.o. female with PMH HTN who is presenting with cough, sore throat, nasal congestion, body aches, chills x 3 days. Cough is dry and worse at night. Very sore throat, worse with coughing. No fever. No sob or wheezing. Took zyrtec-D at night and mucinex during the day - helping some. No hx allergies or asthma. Not a smoker.  She is also wanting a pregnancy test today. States she stopped her birth control 03/2015. Went to see here GYN 05/2015 for blood work. She was told she was in menopause d/t hormone levels. She had a light period 10/16 and 11/16. No menstrual bleeding since. She is having hot flashes occ. She wants to make sure she is not pregnant. Her and her husband have not been using condoms since November.  Review of Systems:  Review of Systems See HPI  Patient Active Problem List   Diagnosis Date Noted  . Unspecified essential hypertension 12/12/2012  . BMI 40.0-44.9, adult (HCC) 12/12/2012  . Anxiety state, unspecified 12/12/2012    Prior to Admission medications   Medication Sig Start Date End Date Taking? Authorizing Provider  olmesartan-hydrochlorothiazide (BENICAR HCT) 40-25 MG per tablet Take 1 tablet by mouth daily.   Yes Historical Provider, MD  norethindrone-ethinyl estradiol (NECON,BREVICON,MODICON) 0.5-35 MG-MCG tablet Take 1 tablet by mouth daily. Reported on 08/17/2015    Historical Provider, MD  potassium phosphate, monobasic, (K-PHOS ORIGINAL) 500 MG tablet Take 500 mg by mouth daily. Reported on 08/17/2015    Historical Provider, MD  predniSONE (DELTASONE) 20 MG tablet Take 2 tablets (40 mg total) by mouth daily with  breakfast. Patient not taking: Reported on 08/17/2015 04/24/15   Ofilia Neas, PA-C    No Known Allergies  Past Surgical History  Procedure Laterality Date  . Fibroid    . Cesarean section      Social History  Substance Use Topics  . Smoking status: Never Smoker   . Smokeless tobacco: Never Used  . Alcohol Use: No    Family History  Problem Relation Age of Onset  . Hypertension Mother   . Diabetes Mother   . Hypertension Father   . Diabetes Maternal Grandmother     Medication list has been reviewed and updated.  Physical Examination:  Physical Exam  Constitutional: She is oriented to person, place, and time. She appears well-developed and well-nourished. No distress.  HENT:  Head: Normocephalic and atraumatic.  Right Ear: Hearing, tympanic membrane, external ear and ear canal normal.  Left Ear: Hearing, tympanic membrane, external ear and ear canal normal.  Nose: Nose normal.  Mouth/Throat: Uvula is midline and mucous membranes are normal. Posterior oropharyngeal erythema (mid) present. No oropharyngeal exudate or posterior oropharyngeal edema.  Eyes: Conjunctivae and lids are normal. Right eye exhibits no discharge. Left eye exhibits no discharge. No scleral icterus.  Cardiovascular: Regular rhythm, normal heart sounds and normal pulses.   Mild tachycardia  Pulmonary/Chest: Effort normal and breath sounds normal. No respiratory distress. She has no wheezes. She has no rhonchi. She has no rales.  Musculoskeletal: Normal range of motion.  Lymphadenopathy:  Head (right side): No submental, no submandibular and no tonsillar adenopathy present.       Head (left side): No submental, no submandibular and no tonsillar adenopathy present.    She has no cervical adenopathy.  Neurological: She is alert and oriented to person, place, and time.  Skin: Skin is warm, dry and intact. No lesion and no rash noted.  Psychiatric: She has a normal mood and affect. Her speech is  normal and behavior is normal. Thought content normal.    BP 138/84 mmHg  Pulse 105  Temp(Src) 98.4 F (36.9 C) (Oral)  Resp 18  Ht 5' 3.5" (1.613 m)  Wt 288 lb 6.4 oz (130.817 kg)  BMI 50.28 kg/m2  SpO2 97%  LMP 06/10/2015  Results for orders placed or performed in visit on 08/17/15  POCT urine pregnancy  Result Value Ref Range   Preg Test, Ur Negative Negative   Assessment and Plan:  1. Amenorrhea Likely in menopause. Advised they use condoms until no vaginal bleeding for 1 year. - POCT urine pregnancy  2. Viral URI with cough Likely viral uri. Focus is on supportive care, see meds prescribed below. Return in 1 week if symptoms do not improve or at any time if symptoms worsen.  - chlorpheniramine-HYDROcodone (TUSSIONEX PENNKINETIC ER) 10-8 MG/5ML SUER; Take 5 mLs by mouth every 12 (twelve) hours as needed for cough.  Dispense: 100 mL; Refill: 0 - benzonatate (TESSALON) 100 MG capsule; Take 1-2 capsules (100-200 mg total) by mouth 3 (three) times daily as needed for cough.  Dispense: 40 capsule; Refill: 0   [place] [employer]. Dyke Brackett, MHS [facility]  08/17/2015

## 2015-08-17 NOTE — Patient Instructions (Signed)
Drink plenty of water (64 oz/day) and get plenty of rest. If you have been prescribed a cough syrup, do not drive or operate heavy machinery while using this medication. Use tessalon during the day cepacol as needed for throat pain If your symptoms are not improving in 1 week, return to clinic.

## 2015-11-28 ENCOUNTER — Other Ambulatory Visit: Payer: Self-pay

## 2015-11-28 DIAGNOSIS — Z1231 Encounter for screening mammogram for malignant neoplasm of breast: Secondary | ICD-10-CM

## 2016-02-17 ENCOUNTER — Ambulatory Visit: Payer: 59

## 2016-03-04 ENCOUNTER — Ambulatory Visit (INDEPENDENT_AMBULATORY_CARE_PROVIDER_SITE_OTHER): Payer: 59 | Admitting: Family Medicine

## 2016-03-04 VITALS — BP 134/80 | HR 115 | Temp 98.9°F | Resp 18 | Ht 63.5 in | Wt 258.4 lb

## 2016-03-04 DIAGNOSIS — M62830 Muscle spasm of back: Secondary | ICD-10-CM

## 2016-03-04 MED ORDER — PREDNISONE 10 MG (21) PO TBPK: 10.0000 mg | ORAL_TABLET | Freq: Every day | ORAL | 0 refills | Status: DC

## 2016-03-04 NOTE — Progress Notes (Signed)
[name] LittenDonna Brickman is a 50 y.o. female who presents to Urgent Care today for muscle spasm  1.  Back spasm:  Patient with known spinal stenosis.  States that she was out walking for exercise and weight loss.  Slipped on the ground about 2 weeks ago, Right foot caught in crack between grass and curb.  Did not fall.  Did pinwheel arms to keep from falling.  Has had pain in thoracic back on Right side, started about 1 week after foot got caught. Ankle was painful initially on Right, but this has been fine for past week.    Has tried her meloxicam with inconsistent relief.  Has had 2 episodes of back pain in the past which resulted in her being prescribed prednisone taper, which helped.  NO bladder/bowel incontinence.  NO fevers/ chills.   ROS as above.    PMH reviewed. Patient is a nonsmoker.   Past Medical History:  Diagnosis Date  . Anxiety   . Arthritis   . Hypertension    Past Surgical History:  Procedure Laterality Date  . CESAREAN SECTION    . fibroid      Medications reviewed. Current Outpatient Prescriptions  Medication Sig Dispense Refill  . olmesartan-hydrochlorothiazide (BENICAR HCT) 40-25 MG per tablet Take 1 tablet by mouth daily.     No current facility-administered medications for this visit.      Physical Exam:  BP 134/80 (BP Location: Left Arm, Patient Position: Sitting, Cuff Size: Normal)   Pulse (!) 115   Temp 98.9 F (37.2 C) (Oral)   Resp 18   Ht 5' 3.5" (1.613 m)   Wt 258 lb 6.4 oz (117.2 kg)   LMP 01/29/2016   SpO2 96%   BMI 45.06 kg/m  Gen:  Alert, cooperative patient who appears stated age in no acute distress.  Vital signs reviewed. HEENT: EOMI,  MMM Pulm:  Clear to auscultation bilaterally with good air movement.  No wheezes or rales noted.   Cardiac:  Regular rate and rhythm without murmur auscultated.  Good S1/S2. Back:  Normal skin, Spine with normal alignment and no deformity.  No tenderness to vertebral process palpation.  Paraspinous muscles are  tender on the Right in the T6-T8 regoin   Range of motion is full at neck but decreased forward flexion lumbar sacral regions.  Straight leg raise is positive for Right sided back pain Neuro:  Sensation and motor function 5/5 bilateral lower extremities.  Patellar and Achilles  DTR's +2 patellar BL.      Assessment and Plan:  1.  Muscle spasm: - heat and ice to treat. - heading to the beach today. Can continue meloxicam.  To stop more often and stretch back during her ride to prevent stiffness - pred-pak prescribed today.  - FU as needed if not improved within next 7 - 10 days.  - no red flags. a

## 2016-03-04 NOTE — Patient Instructions (Addendum)
You have a muscle spasm in your back.  Take the prednisone in a decreasing taper as prescribed.  You can continue your meloxicam with this.   It was good to see you today. Enjoy the beach.   IF you received an x-ray today, you will receive an [facility] from [facility]. Please contact [facility] at [phone] with questions or concerns regarding your invoice.   IF you received labwork today, you will receive an invoice from [facility]. Please contact [facility] at [phone] with questions or concerns regarding your invoice.   Our billing staff will not be able to assist you with questions regarding bills from these companies.  You will be contacted with the lab results as soon as they are available. The fastest way to get your results is to activate your My Chart account. Instructions are located on the last page of this paperwork. If you have not heard from us regarding the results in 2 weeks, please contact this office.

## 2016-03-30 ENCOUNTER — Ambulatory Visit: Payer: 59

## 2016-04-20 ENCOUNTER — Ambulatory Visit
Admission: RE | Admit: 2016-04-20 | Discharge: 2016-04-20 | Disposition: A | Discharge status: Home / Self Care | Payer: 59 | Source: Ambulatory Visit

## 2016-04-20 DIAGNOSIS — Z1231 Encounter for screening mammogram for malignant neoplasm of breast: Secondary | ICD-10-CM

## 2017-07-26 ENCOUNTER — Other Ambulatory Visit: Payer: Self-pay | Admitting: Family

## 2017-07-26 DIAGNOSIS — Z1231 Encounter for screening mammogram for malignant neoplasm of breast: Secondary | ICD-10-CM

## 2017-09-23 ENCOUNTER — Ambulatory Visit
Admission: RE | Admit: 2017-09-23 | Discharge: 2017-09-23 | Disposition: A | Discharge status: Home / Self Care | Payer: 59 | Source: Ambulatory Visit | Attending: Family | Admitting: Family

## 2017-09-23 DIAGNOSIS — Z1231 Encounter for screening mammogram for malignant neoplasm of breast: Secondary | ICD-10-CM

## 2019-07-12 ENCOUNTER — Other Ambulatory Visit: Payer: Self-pay | Admitting: Family

## 2019-07-12 DIAGNOSIS — Z1231 Encounter for screening mammogram for malignant neoplasm of breast: Secondary | ICD-10-CM

## 2019-07-24 ENCOUNTER — Other Ambulatory Visit: Payer: Self-pay

## 2019-07-24 ENCOUNTER — Ambulatory Visit
Admission: RE | Admit: 2019-07-24 | Discharge: 2019-07-24 | Disposition: A | Discharge status: Home / Self Care | Payer: 59 | Source: Ambulatory Visit | Attending: Family | Admitting: Family

## 2019-07-24 DIAGNOSIS — Z1231 Encounter for screening mammogram for malignant neoplasm of breast: Secondary | ICD-10-CM

## 2020-12-10 ENCOUNTER — Other Ambulatory Visit: Payer: Self-pay | Admitting: Physician Assistant

## 2020-12-10 DIAGNOSIS — M8580 Other specified disorders of bone density and structure, unspecified site: Secondary | ICD-10-CM

## 2020-12-13 ENCOUNTER — Ambulatory Visit
Admission: RE | Admit: 2020-12-13 | Discharge: 2020-12-13 | Disposition: A | Discharge status: Home / Self Care | Payer: 59 | Source: Ambulatory Visit | Attending: Physician Assistant | Admitting: Physician Assistant

## 2020-12-13 ENCOUNTER — Other Ambulatory Visit: Payer: Self-pay

## 2020-12-13 DIAGNOSIS — M8580 Other specified disorders of bone density and structure, unspecified site: Secondary | ICD-10-CM

## 2021-05-01 ENCOUNTER — Other Ambulatory Visit: Payer: Self-pay | Admitting: Nurse Practitioner

## 2021-05-01 DIAGNOSIS — Z1231 Encounter for screening mammogram for malignant neoplasm of breast: Secondary | ICD-10-CM

## 2021-08-08 ENCOUNTER — Ambulatory Visit
Admission: RE | Admit: 2021-08-08 | Discharge: 2021-08-08 | Disposition: A | Discharge status: Home / Self Care | Payer: 59 | Source: Ambulatory Visit | Attending: Nurse Practitioner | Admitting: Nurse Practitioner

## 2021-08-08 DIAGNOSIS — Z1231 Encounter for screening mammogram for malignant neoplasm of breast: Secondary | ICD-10-CM

## 2021-08-20 ENCOUNTER — Other Ambulatory Visit (HOSPITAL_BASED_OUTPATIENT_CLINIC_OR_DEPARTMENT_OTHER): Payer: Self-pay

## 2021-08-20 DIAGNOSIS — G4730 Sleep apnea, unspecified: Secondary | ICD-10-CM

## 2022-03-22 ENCOUNTER — Encounter: Payer: Self-pay | Admitting: Emergency Medicine

## 2022-03-22 ENCOUNTER — Ambulatory Visit
Admission: EM | Admit: 2022-03-22 | Discharge: 2022-03-22 | Disposition: A | Discharge status: Home / Self Care | Payer: 59 | Attending: Internal Medicine | Admitting: Internal Medicine

## 2022-03-22 DIAGNOSIS — U071 COVID-19: Secondary | ICD-10-CM | POA: Diagnosis present

## 2022-03-22 DIAGNOSIS — J069 Acute upper respiratory infection, unspecified: Secondary | ICD-10-CM

## 2022-03-22 LAB — SARS CORONAVIRUS 2 BY RT PCR: SARS Coronavirus 2 by RT PCR: POSITIVE — AB

## 2022-03-22 MED ORDER — BENZONATATE 100 MG PO CAPS: 100.0000 mg | ORAL_CAPSULE | Freq: Three times a day (TID) | ORAL | 0 refills | Status: DC | PRN

## 2022-03-22 MED ORDER — FLUTICASONE PROPIONATE 50 MCG/ACT NA SUSP: 1.0000 | Freq: Every day | NASAL | 0 refills | Status: DC

## 2022-03-22 NOTE — Discharge Instructions (Signed)
It appears that you have a viral upper respiratory infection as we discussed which should run its course and self resolve with symptomatic treatment.  I have prescribed you 2 medications to alleviate symptoms.  COVID test is pending.

## 2022-03-22 NOTE — ED Provider Notes (Signed)
[facility]    CSN: [id number] Arrival date & time: 03/22/22  1412      History   Chief Complaint Chief Complaint  Patient presents with   Cough    HPI [name] Skinner is a 56 y.o. female.   Patient presents with headache, cough, nasal congestion that started yesterday.  Patient denies any known sick contacts.  Denies any known fevers.  Denies chest pain, shortness of breath, sore throat, ear pain, nausea, vomiting, diarrhea, abdominal pain.  Patient is taking Allegra-D with minimal improvement of symptoms.  Patient also has elevated heart rate but is not sure if this is normal for her.  Denies dizziness or blurred vision.   Cough   Past Medical History:  Diagnosis Date   Anxiety    Arthritis    Hypertension     Patient Active Problem List   Diagnosis Date Noted   Unspecified essential hypertension 12/12/2012   BMI 40.0-44.9, adult (HCC) 12/12/2012   Anxiety state, unspecified 12/12/2012    Past Surgical History:  Procedure Laterality Date   CESAREAN SECTION     fibroid      OB History   No obstetric history on file.      Home Medications    Prior to Admission medications   Medication Sig Start Date End Date Taking? Authorizing Provider  benzonatate (TESSALON) 100 MG capsule Take 1 capsule (100 mg total) by mouth every 8 (eight) hours as needed for cough. 03/22/22  Yes Avelina Mcclurkin, Acie Fredrickson, FNP  fluticasone (FLONASE) 50 MCG/ACT nasal spray Place 1 spray into both nostrils daily for 3 days. 03/22/22 03/25/22 Yes Belina Mandile, Acie Fredrickson, FNP  olmesartan-hydrochlorothiazide (BENICAR HCT) 40-25 MG per tablet Take 1 tablet by mouth daily.    [provider]  predniSONE (STERAPRED UNI-PAK 21 TAB) 10 MG (21) TBPK tablet Take 1 tablet (10 mg total) by mouth daily. Take 6 pils po daily today, 5 pills tomorrow, 4 pills day after, 3 pills day after, 2 pills day after, 1 pill final day 03/04/16   Tobey Grim, MD    Family History Family History  Problem  Relation Age of Onset   Hypertension Mother    Diabetes Mother    Hypertension Father    Diabetes Maternal Grandmother     Social History Social History   Tobacco Use   Smoking status: Never   Smokeless tobacco: Never  Substance Use Topics   Alcohol use: No   Drug use: No     Allergies   Patient has no known allergies.   Review of Systems Review of Systems Per HPI  Physical Exam Triage Vital Signs ED Triage Vitals [03/22/22 1436]  Enc Vitals Group     BP 119/84     Pulse Rate (!) 120     Resp 18     Temp 98.7 F (37.1 C)     Temp src      SpO2 96 %     Weight      Height      Head Circumference      Peak Flow      Pain Score 0     Pain Loc      Pain Edu?      Excl. in GC?    No data found.  Updated Vital Signs BP 119/84   Pulse (!) 120   Temp 98.7 F (37.1 C)   Resp 18   LMP 01/29/2016   SpO2 96%   Visual Acuity Right  Eye Distance:   Left Eye Distance:   Bilateral Distance:    Right Eye Near:   Left Eye Near:    Bilateral Near:     Physical Exam Constitutional:      General: She is not in acute distress.    Appearance: Normal appearance. She is not toxic-appearing or diaphoretic.  HENT:     Head: Normocephalic and atraumatic.     Right Ear: Tympanic membrane and ear canal normal.     Left Ear: Tympanic membrane and ear canal normal.     Nose: Congestion present.     Mouth/Throat:     Mouth: Mucous membranes are moist.     Pharynx: No posterior oropharyngeal erythema.  Eyes:     Extraocular Movements: Extraocular movements intact.     Conjunctiva/sclera: Conjunctivae normal.     Pupils: Pupils are equal, round, and reactive to light.  Cardiovascular:     Rate and Rhythm: Regular rhythm. Tachycardia present.     Pulses: Normal pulses.     Heart sounds: Normal heart sounds.  Pulmonary:     Effort: Pulmonary effort is normal. No respiratory distress.     Breath sounds: Normal breath sounds. No stridor. No wheezing, rhonchi or rales.   Abdominal:     General: Abdomen is flat. Bowel sounds are normal.     Palpations: Abdomen is soft.  Musculoskeletal:        General: Normal range of motion.     Cervical back: Normal range of motion.  Skin:    General: Skin is warm and dry.  Neurological:     General: No focal deficit present.     Mental Status: She is alert and oriented to person, place, and time. Mental status is at baseline.  Psychiatric:        Mood and Affect: Mood normal.        Behavior: Behavior normal.      UC Treatments / Results  Labs (all labs ordered are listed, but only abnormal results are displayed) Labs Reviewed  SARS CORONAVIRUS 2 BY RT PCR    EKG   Radiology No results found.  Procedures Procedures (including critical care time)  Medications Ordered in UC Medications - No data to display  Initial Impression / Assessment and Plan / UC Course  I have reviewed the triage vital signs and the nursing notes.  Pertinent labs & imaging results that were available during my care of the patient were reviewed by me and considered in my medical decision making (see chart for details).     Patient presents with symptoms likely from a viral upper respiratory infection. Differential includes bacterial pneumonia, sinusitis, allergic rhinitis, covid 19, flu. Do not suspect underlying cardiopulmonary process. Symptoms seem unlikely related to ACS, CHF or COPD exacerbations, pneumonia, pneumothorax. Patient is nontoxic appearing and not in need of emergent medical intervention.  COVID test pending.  Patient also has tachycardia with recheck being similar.  After further review of patient's chart, this appears baseline.  Patient is not symptomatic so no further work-up is necessary.  Recommended symptom control with over the counter medications that are safe for patient.  Patient sent prescriptions.  Return if symptoms fail to improve in 1-2 weeks or you develop shortness of breath, chest pain,  severe headache. Patient states understanding and is agreeable.  Discharged with PCP followup.  Final Clinical Impressions(s) / UC Diagnoses   Final diagnoses:  Viral upper respiratory tract infection with cough     Discharge  Instructions      It appears that you have a viral upper respiratory infection as we discussed which should run its course and self resolve with symptomatic treatment.  I have prescribed you 2 medications to alleviate symptoms.  COVID test is pending.    ED Prescriptions     Medication Sig Dispense Auth. Provider   fluticasone (FLONASE) 50 MCG/ACT nasal spray Place 1 spray into both nostrils daily for 3 days. 16 g [place] Zilberman, Rolly Salter E, [place]   benzonatate (TESSALON) 100 MG capsule Take 1 capsule (100 mg total) by mouth every 8 (eight) hours as needed for cough. 21 capsule [place], Acie Fredrickson, [place]      PDMP not reviewed this encounter.   Gustavus Bryant, [place] 03/22/22 [phone]

## 2022-03-22 NOTE — ED Triage Notes (Signed)
Pt is present today with c/o HA, cough, and nasal congestion. Pt sx started yesterday

## 2022-03-23 ENCOUNTER — Telehealth: Payer: Self-pay | Admitting: Physician Assistant

## 2022-03-23 MED ORDER — PROMETHAZINE-DM 6.25-15 MG/5ML PO SYRP: 5.0000 mL | ORAL_SOLUTION | Freq: Four times a day (QID) | ORAL | 0 refills | Status: DC | PRN

## 2022-03-23 NOTE — Telephone Encounter (Signed)
We do not have labs available for review to determine if she is eligible for paxlovid. She will need to contact her PCP for same. I will send in liquid cough syrup at her request, no controlled substance as I did not see patient personally. Encourage follow up with PCP with any further concerns.

## 2022-04-13 ENCOUNTER — Ambulatory Visit
Admission: EM | Admit: 2022-04-13 | Discharge: 2022-04-13 | Disposition: A | Discharge status: Home / Self Care | Payer: 59

## 2022-04-13 DIAGNOSIS — B029 Zoster without complications: Secondary | ICD-10-CM | POA: Diagnosis not present

## 2022-04-13 DIAGNOSIS — K047 Periapical abscess without sinus: Secondary | ICD-10-CM | POA: Diagnosis not present

## 2022-04-13 MED ORDER — AMOXICILLIN-POT CLAVULANATE 875-125 MG PO TABS: 1.0000 | ORAL_TABLET | Freq: Two times a day (BID) | ORAL | 0 refills | Status: DC

## 2022-04-13 MED ORDER — VALACYCLOVIR HCL 1 G PO TABS: 1000.0000 mg | ORAL_TABLET | Freq: Three times a day (TID) | ORAL | 0 refills | Status: AC

## 2022-04-13 MED ORDER — VALACYCLOVIR HCL 1 G PO TABS: 1000.0000 mg | ORAL_TABLET | Freq: Three times a day (TID) | ORAL | 0 refills | Status: DC

## 2022-04-13 NOTE — Discharge Instructions (Signed)
You appear to have a dental infection/gum infection so this is being treated with Augmentin antibiotic.  It also appears that you have shingles so this is being treated with an antiviral medication.  Please follow-up if any symptoms persist or worsen.  Follow-up with dentist for gum pain.

## 2022-04-13 NOTE — ED Provider Notes (Signed)
[facility]    CSN: [id number] Arrival date & time: 04/13/22  1640      History   Chief Complaint Chief Complaint  Patient presents with   sore gums and painful bumps    HPI [name] Skinner is a 56 y.o. female.   Patient presents with 2 different chief complaints today.  Patient reports that she has been having painful gums mainly in the upper portion that started a few days prior.  Denies trauma to the area.  Patient reports that she has periodontal disease and has to have frequent cleanings.  Denies any history of dental infections.  Denies any associated fever, body aches, chills.  Also reports rash to right side of abdomen that has been present for a few days.  Patient reports it is a painful rash and is minimally itchy.  Denies changes to lotions, detergents, foods, soaps, etc.  Denies any associated fever.  Denies any known sick contacts.     Past Medical History:  Diagnosis Date   Anxiety    Arthritis    Hypertension     Patient Active Problem List   Diagnosis Date Noted   Unspecified essential hypertension 12/12/2012   BMI 40.0-44.9, adult ([place]) 12/12/2012   Anxiety state, unspecified 12/12/2012    Past Surgical History:  Procedure Laterality Date   CESAREAN SECTION     fibroid      OB History   No obstetric history on file.      Home Medications    Prior to Admission medications   Medication Sig Start Date End Date Taking? Authorizing Provider  amoxicillin-clavulanate (AUGMENTIN) 875-125 MG tablet Take 1 tablet by mouth every 12 (twelve) hours. 04/13/22   Teodora Medici, FNP  benzonatate (TESSALON) 100 MG capsule Take 1 capsule (100 mg total) by mouth every 8 (eight) hours as needed for cough. 03/22/22   Teodora Medici, FNP  FLUoxetine (PROZAC) 20 MG tablet Take 20 mg by mouth daily. 03/16/22   [provider]  fluticasone (FLONASE) 50 MCG/ACT nasal spray Place 1 spray into both nostrils daily for 3 days. 03/22/22 03/25/22  Teodora Medici, FNP  MOUNJARO 10 MG/0.5ML Pen SMARTSIG:0.5 Milliliter(s) SUB-Q Once a Week 03/14/22   [provider]  olmesartan-hydrochlorothiazide (BENICAR HCT) 40-25 MG per tablet Take 1 tablet by mouth daily.    [provider]  predniSONE (STERAPRED UNI-PAK 21 TAB) 10 MG (21) TBPK tablet Take 1 tablet (10 mg total) by mouth daily. Take 6 pils po daily today, 5 pills tomorrow, 4 pills day after, 3 pills day after, 2 pills day after, 1 pill final day 03/04/16   Alveda Reasons, MD  promethazine-dextromethorphan (PROMETHAZINE-DM) 6.25-15 MG/5ML syrup Take 5 mLs by mouth 4 (four) times daily as needed for cough. 03/23/22   Francene Finders, PA-C  valACYclovir (VALTREX) 1000 MG tablet Take 1 tablet (1,000 mg total) by mouth 3 (three) times daily for 10 days. 04/13/22 04/23/22  Teodora Medici, FNP    Family History Family History  Problem Relation Age of Onset   Hypertension Mother    Diabetes Mother    Hypertension Father    Diabetes Maternal Grandmother     Social History Social History   Tobacco Use   Smoking status: Never   Smokeless tobacco: Never  Substance Use Topics   Alcohol use: No   Drug use: No     Allergies   Patient has no known allergies.   Review of Systems Review of  Systems Per HPI  Physical Exam Triage Vital Signs ED Triage Vitals [04/13/22 1718]  Enc Vitals Group     BP 115/80     Pulse Rate (!) 110     Resp 16     Temp 98 F (36.7 C)     Temp Source Oral     SpO2 97 %     Weight      Height      Head Circumference      Peak Flow      Pain Score 0     Pain Loc      Pain Edu?      Excl. in GC?    No data found.  Updated Vital Signs BP 115/80 (BP Location: Left Arm)   Pulse (!) 110   Temp 98 F (36.7 C) (Oral)   Resp 16   LMP 01/29/2016   SpO2 97%   Visual Acuity Right Eye Distance:   Left Eye Distance:   Bilateral Distance:    Right Eye Near:   Left Eye Near:    Bilateral Near:     Physical Exam Constitutional:       General: She is not in acute distress.    Appearance: Normal appearance. She is not toxic-appearing or diaphoretic.  HENT:     Head: Normocephalic and atraumatic.     Mouth/Throat:     Lips: Pink.     Mouth: Mucous membranes are moist.     Comments: Patient has erythema with mild gingival swelling present to left upper gums.  Similar to right upper gums. Eyes:     Extraocular Movements: Extraocular movements intact.     Conjunctiva/sclera: Conjunctivae normal.  Pulmonary:     Effort: Pulmonary effort is normal.  Skin:    Comments: Patient has patches of vesicular/papular erythematous rash present to right side of abdomen that appears to follow a dermatome. Lesions are intact.  Neurological:     General: No focal deficit present.     Mental Status: She is alert and oriented to person, place, and time. Mental status is at baseline.  Psychiatric:        Mood and Affect: Mood normal.        Behavior: Behavior normal.        Thought Content: Thought content normal.        Judgment: Judgment normal.      UC Treatments / Results  Labs (all labs ordered are listed, but only abnormal results are displayed) Labs Reviewed - No data to display  EKG   Radiology No results found.  Procedures Procedures (including critical care time)  Medications Ordered in UC Medications - No data to display  Initial Impression / Assessment and Plan / UC Course  I have reviewed the triage vital signs and the nursing notes.  Pertinent labs & imaging results that were available during my care of the patient were reviewed by me and considered in my medical decision making (see chart for details).     There is concern for dental infection given appearance of gingival swelling and erythema on exam.  Will treat with Augmentin antibiotic.  Patient advised to follow-up with her dentist for further evaluation and management.  Rash to abdomen appears to be herpes zoster.  Will treat with Valtrex.  No  signs of bacterial infection on exam.  Patient advised to follow-up if any symptoms persist or worsen.  Patient has tachycardia but this appears baseline for her and no further work-up  for this is needed.  Patient verbalized understanding and was agreeable with plan. Final Clinical Impressions(s) / UC Diagnoses   Final diagnoses:  Dental infection  Herpes zoster without complication     Discharge Instructions      You appear to have a dental infection/gum infection so this is being treated with Augmentin antibiotic.  It also appears that you have shingles so this is being treated with an antiviral medication.  Please follow-up if any symptoms persist or worsen.  Follow-up with dentist for gum pain.    ED Prescriptions     Medication Sig Dispense Auth. Provider   valACYclovir (VALTREX) 1000 MG tablet  (Status: Discontinued) Take 1 tablet (1,000 mg total) by mouth 3 (three) times daily for 10 days. 30 tablet [place], Ulysses E, [place]   amoxicillin-clavulanate (AUGMENTIN) 875-125 MG tablet  (Status: Discontinued) Take 1 tablet by mouth every 12 (twelve) hours. 14 tablet [place], [place] E, [place]   amoxicillin-clavulanate (AUGMENTIN) 875-125 MG tablet Take 1 tablet by mouth every 12 (twelve) hours. 14 tablet [place], [place] E, [place]   valACYclovir (VALTREX) 1000 MG tablet Take 1 tablet (1,000 mg total) by mouth 3 (three) times daily for 10 days. 30 tablet [place], Acie Fredrickson, [place]      PDMP not reviewed this encounter.   Gustavus Bryant, [place] 04/13/22 [phone]

## 2022-04-13 NOTE — ED Triage Notes (Signed)
Pt c/o painful bumps to abd x 1 week and sore gums.

## 2022-07-30 ENCOUNTER — Other Ambulatory Visit: Payer: Self-pay | Admitting: Nurse Practitioner

## 2022-07-30 DIAGNOSIS — Z1231 Encounter for screening mammogram for malignant neoplasm of breast: Secondary | ICD-10-CM

## 2022-09-23 ENCOUNTER — Ambulatory Visit
Admission: RE | Admit: 2022-09-23 | Discharge: 2022-09-23 | Disposition: A | Discharge status: Home / Self Care | Payer: 59 | Source: Ambulatory Visit | Attending: Nurse Practitioner | Admitting: Nurse Practitioner

## 2022-09-23 ENCOUNTER — Other Ambulatory Visit (HOSPITAL_COMMUNITY): Payer: Self-pay

## 2022-09-23 DIAGNOSIS — Z1231 Encounter for screening mammogram for malignant neoplasm of breast: Secondary | ICD-10-CM

## 2022-09-23 MED ORDER — MOUNJARO 15 MG/0.5ML ~~LOC~~ SOAJ
15.0000 mg | SUBCUTANEOUS | 1 refills | Status: AC
  Filled 2022-09-23 – 2022-12-16 (×3): qty 2, 28d supply, fill #0
  Filled 2023-01-18: qty 2, 28d supply, fill #1

## 2022-09-24 ENCOUNTER — Other Ambulatory Visit: Payer: Self-pay

## 2022-09-24 ENCOUNTER — Other Ambulatory Visit (HOSPITAL_COMMUNITY): Payer: Self-pay

## 2022-09-24 MED ORDER — MOUNJARO 12.5 MG/0.5ML ~~LOC~~ SOAJ
12.5000 mg | SUBCUTANEOUS | 1 refills | Status: AC
  Filled 2022-09-24 (×2): qty 2, 28d supply, fill #0
  Filled 2022-12-16: qty 2, 28d supply, fill #1

## 2022-09-25 ENCOUNTER — Other Ambulatory Visit (HOSPITAL_COMMUNITY): Payer: Self-pay

## 2022-09-25 MED ORDER — MOUNJARO 12.5 MG/0.5ML ~~LOC~~ SOAJ
12.5000 mg | SUBCUTANEOUS | 1 refills | Status: AC
  Filled 2022-09-25 – 2022-10-21 (×5): qty 2, 28d supply, fill #0
  Filled 2022-11-11: qty 2, 28d supply, fill #1

## 2022-10-10 ENCOUNTER — Ambulatory Visit (INDEPENDENT_AMBULATORY_CARE_PROVIDER_SITE_OTHER): Payer: 59

## 2022-10-10 ENCOUNTER — Ambulatory Visit
Admission: EM | Admit: 2022-10-10 | Discharge: 2022-10-10 | Disposition: A | Discharge status: Home / Self Care | Payer: 59 | Attending: Physician Assistant | Admitting: Physician Assistant

## 2022-10-10 ENCOUNTER — Ambulatory Visit: Payer: 59

## 2022-10-10 ENCOUNTER — Other Ambulatory Visit: Payer: Self-pay

## 2022-10-10 ENCOUNTER — Encounter: Payer: Self-pay | Admitting: Emergency Medicine

## 2022-10-10 DIAGNOSIS — M79644 Pain in right finger(s): Secondary | ICD-10-CM | POA: Diagnosis not present

## 2022-10-10 DIAGNOSIS — L03011 Cellulitis of right finger: Secondary | ICD-10-CM | POA: Diagnosis not present

## 2022-10-10 MED ORDER — NAPROXEN 500 MG PO TABS: 500.0000 mg | ORAL_TABLET | Freq: Two times a day (BID) | ORAL | 0 refills | Status: DC

## 2022-10-10 MED ORDER — CEPHALEXIN 500 MG PO CAPS: 500.0000 mg | ORAL_CAPSULE | Freq: Three times a day (TID) | ORAL | 0 refills | Status: DC

## 2022-10-10 NOTE — Discharge Instructions (Signed)
Your x-ray did not show any obvious fracture or evidence of infection into the bone.  I am concerned that you have an infection of the soft tissue given the redness and swelling.  Please start cephalexin 3 times a day for 7 days.  Take this with food as it can upset your stomach.  Continue over-the-counter medications for pain relief including Naprosyn.  Follow-up with your primary care within the week to ensure that symptoms are improving.  If anything worsens and you have increasing pain, increasing swelling, increasing redness, fever, nausea, vomiting you need to go to the emergency room.

## 2022-10-10 NOTE — ED Triage Notes (Signed)
Pt here for right thumb pain and swelling x 3 days

## 2022-10-10 NOTE — ED Provider Notes (Signed)
[facility]    CSN: [id number] Arrival date & time: 10/10/22  0901      History   Chief Complaint Chief Complaint  Patient presents with   Hand Pain    HPI [name] Skinner is a 57 y.o. female.   Patient presents today with a 3-day history of worsening erythema and swelling of her right thumb.  She denies any known injury or increase in activity prior to symptom onset.  Did note that she had a small wound on her dorsal right thumb prior to symptoms beginning but is unsure how this occurred.  She has not been cleaning it with anything specific.  Denies history of recurrent skin infections including MRSA.  She denies history of gout or rheumatoid arthritis.  Reports that she has taken a dose of naproxen this morning which provided improvement of symptoms.  Pain has essentially resolved but she continues to have swelling and decreased range of motion.  She took 2 doses of amoxicillin that she had leftover from a previous prescription but has not noticed any change in symptoms.  She denies additional antibiotics in the past 90 days.    Past Medical History:  Diagnosis Date   Anxiety    Arthritis    Hypertension     Patient Active Problem List   Diagnosis Date Noted   Unspecified essential hypertension 12/12/2012   BMI 40.0-44.9, adult ([place]) 12/12/2012   Anxiety state, unspecified 12/12/2012    Past Surgical History:  Procedure Laterality Date   CESAREAN SECTION     fibroid      OB History   No obstetric history on file.      Home Medications    Prior to Admission medications   Medication Sig Start Date End Date Taking? Authorizing Provider  cephALEXin (KEFLEX) 500 MG capsule Take 1 capsule (500 mg total) by mouth 3 (three) times daily. 10/10/22  Yes Cowan Pilar K, PA-C  FLUoxetine (PROZAC) 20 MG tablet Take 20 mg by mouth daily. 03/16/22   [provider]  fluticasone (FLONASE) 50 MCG/ACT nasal spray Place 1 spray into both nostrils daily for 3 days.  03/22/22 03/25/22  Teodora Medici, FNP  MOUNJARO 10 MG/0.5ML Pen SMARTSIG:0.5 Milliliter(s) SUB-Q Once a Week 03/14/22   [provider]  olmesartan-hydrochlorothiazide (BENICAR HCT) 40-25 MG per tablet Take 1 tablet by mouth daily.    [provider]  tirzepatide Darcel Bayley) 12.5 MG/0.5ML Pen Inject 12.5 mg into the skin once a week. 09/23/22     tirzepatide (MOUNJARO) 12.5 MG/0.5ML Pen Inject 12.5 mg into the skin once a week. 09/23/22   Vonna Drafts, FNP  tirzepatide [facility]) 15 MG/0.5ML Pen Inject 15 mg into the skin once a week. 09/23/22       Family History Family History  Problem Relation Age of Onset   Hypertension Mother    Diabetes Mother    Hypertension Father    Diabetes Maternal Grandmother    Breast cancer Neg Hx     Social History Social History   Tobacco Use   Smoking status: Never   Smokeless tobacco: Never  Substance Use Topics   Alcohol use: No   Drug use: No     Allergies   Patient has no known allergies.   Review of Systems Review of Systems  Constitutional:  Positive for activity change. Negative for appetite change, fatigue and fever.  Gastrointestinal:  Negative for abdominal pain, diarrhea, nausea and vomiting.  Musculoskeletal:  Positive for arthralgias and joint  swelling. Negative for myalgias.  Skin:  Positive for color change and wound.  Neurological:  Negative for weakness and numbness.     Physical Exam Triage Vital Signs ED Triage Vitals [10/10/22 0928]  Enc Vitals Group     BP 124/84     Pulse Rate 93     Resp 18     Temp 98 F (36.7 C)     Temp Source Oral     SpO2 96 %     Weight      Height      Head Circumference      Peak Flow      Pain Score 5     Pain Loc      Pain Edu?      Excl. in [place]?    No data found.  Updated Vital Signs BP 124/84 (BP Location: Left Arm)   Pulse 93   Temp 98 F (36.7 C) (Oral)   Resp 18   LMP 01/29/2016   SpO2 96%   Visual Acuity Right Eye Distance:   Left Eye  Distance:   Bilateral Distance:    Right Eye Near:   Left Eye Near:    Bilateral Near:     Physical Exam Vitals reviewed.  Constitutional:      General: She is awake. She is not in acute distress.    Appearance: Normal appearance. She is well-developed. She is not ill-appearing.     Comments: Very pleasant female appears stated age in no acute distress sitting comfortably in exam room  HENT:     Head: Normocephalic and atraumatic.  Cardiovascular:     Rate and Rhythm: Normal rate and regular rhythm.     Heart sounds: Normal heart sounds, S1 normal and S2 normal. No murmur heard.    Comments: Capillary refill within 2 seconds right thumb Pulmonary:     Effort: Pulmonary effort is normal.     Breath sounds: Normal breath sounds. No wheezing, rhonchi or rales.     Comments: Clear to auscultation bilaterally Musculoskeletal:     Right hand: Swelling and tenderness present. No bony tenderness. Decreased range of motion. There is no disruption of two-point discrimination. Normal capillary refill.     Comments: Right thumb: Decreased range of motion with flexion at PIP secondary to pain and swelling.  Small abrasion noted over dorsal PIP joint with surrounding erythema and swelling throughout thumb.  Thumb neurovascularly intact.  Psychiatric:        Behavior: Behavior is cooperative.      UC Treatments / Results  Labs (all labs ordered are listed, but only abnormal results are displayed) Labs Reviewed - No data to display  EKG   Radiology DG Finger Thumb Right  Result Date: 10/10/2022 CLINICAL DATA:  Thumb pain and swelling for 3 days. EXAM: RIGHT THUMB 2+V COMPARISON:  None Available. FINDINGS: Ovoid calcification over the interphalangeal joint is likely sesamoid. No erosive changes or joint space narrowing. No fracture or subluxation. IMPRESSION: Negative for fracture or erosion. Electronically Signed   By: Jorje Guild M.D.   On: 10/10/2022 11:33    Procedures Procedures  (including critical care time)  Medications Ordered in UC Medications - No data to display  Initial Impression / Assessment and Plan / UC Course  I have reviewed the triage vital signs and the nursing notes.  Pertinent labs & imaging results that were available during my care of the patient were reviewed by me and considered in my medical decision  making (see chart for details).     Patient is well-appearing, afebrile, nontoxic, nontachycardic.  Concern for cellulitis given recent wound with associated erythema and swelling.  Will start cephalexin 3 times daily for 7 days as she has no significant risk factors for MRSA.  X-ray was obtained which showed no acute abnormality.  She was encouraged to use over-the-counter medication including Naprosyn for pain relief.  Recommend that she keep the area elevated and monitor redness and swelling.  If she has any worsening symptoms despite being on medication for few days she needs to be seen immediately.  If symptoms or not resolved within a week she is to return here or see her primary care.  If she has any worsening symptoms including increasing swelling, increasing redness, fever, nausea, vomiting, pain, numbness, paresthesias she needs to be seen immediately.  Strict return precautions given.  Work excuse note provided.  Final Clinical Impressions(s) / UC Diagnoses   Final diagnoses:  Cellulitis of right thumb     Discharge Instructions      Your x-ray did not show any obvious fracture or evidence of infection into the bone.  I am concerned that you have an infection of the soft tissue given the redness and swelling.  Please start cephalexin 3 times a day for 7 days.  Take this with food as it can upset your stomach.  Continue over-the-counter medications for pain relief including Naprosyn.  Follow-up with your primary care within the week to ensure that symptoms are improving.  If anything worsens and you have increasing pain, increasing  swelling, increasing redness, fever, nausea, vomiting you need to go to the emergency room.     ED Prescriptions     Medication Sig Dispense Auth. Provider   cephALEXin (KEFLEX) 500 MG capsule Take 1 capsule (500 mg total) by mouth 3 (three) times daily. 21 capsule Kaelyn Innocent K, PA-C      PDMP not reviewed this encounter.   Terrilee Croak, PA-C 10/10/22 1145

## 2022-10-13 ENCOUNTER — Other Ambulatory Visit (HOSPITAL_COMMUNITY): Payer: Self-pay

## 2022-10-16 ENCOUNTER — Other Ambulatory Visit (HOSPITAL_COMMUNITY): Payer: Self-pay

## 2022-10-19 ENCOUNTER — Other Ambulatory Visit (HOSPITAL_COMMUNITY): Payer: Self-pay

## 2022-10-21 ENCOUNTER — Other Ambulatory Visit (HOSPITAL_COMMUNITY): Payer: Self-pay

## 2022-10-21 MED ORDER — MOUNJARO 7.5 MG/0.5ML ~~LOC~~ SOAJ
7.5000 mg | SUBCUTANEOUS | 1 refills | Status: DC
  Filled 2022-10-21: qty 2, 28d supply, fill #0

## 2022-11-11 ENCOUNTER — Other Ambulatory Visit (HOSPITAL_COMMUNITY): Payer: Self-pay

## 2022-12-16 ENCOUNTER — Other Ambulatory Visit (HOSPITAL_COMMUNITY): Payer: Self-pay

## 2022-12-17 ENCOUNTER — Other Ambulatory Visit (HOSPITAL_COMMUNITY): Payer: Self-pay

## 2023-01-18 ENCOUNTER — Other Ambulatory Visit (HOSPITAL_COMMUNITY): Payer: Self-pay

## 2023-02-09 ENCOUNTER — Other Ambulatory Visit (HOSPITAL_COMMUNITY): Payer: Self-pay

## 2023-02-09 ENCOUNTER — Other Ambulatory Visit (HOSPITAL_BASED_OUTPATIENT_CLINIC_OR_DEPARTMENT_OTHER): Payer: Self-pay

## 2023-02-10 ENCOUNTER — Other Ambulatory Visit (HOSPITAL_COMMUNITY): Payer: Self-pay

## 2023-03-16 ENCOUNTER — Other Ambulatory Visit (HOSPITAL_COMMUNITY): Payer: Self-pay

## 2023-05-28 ENCOUNTER — Other Ambulatory Visit (HOSPITAL_COMMUNITY): Payer: Self-pay

## 2023-07-20 ENCOUNTER — Other Ambulatory Visit: Payer: Self-pay | Admitting: Nurse Practitioner

## 2023-07-20 DIAGNOSIS — Z1231 Encounter for screening mammogram for malignant neoplasm of breast: Secondary | ICD-10-CM

## 2023-07-22 ENCOUNTER — Encounter: Payer: Self-pay | Admitting: Internal Medicine

## 2023-09-08 ENCOUNTER — Ambulatory Visit (AMBULATORY_SURGERY_CENTER): Payer: 59

## 2023-09-08 VITALS — Ht 64.0 in | Wt 218.0 lb

## 2023-09-08 DIAGNOSIS — Z1211 Encounter for screening for malignant neoplasm of colon: Secondary | ICD-10-CM

## 2023-09-08 MED ORDER — SUFLAVE 178.7 G PO SOLR: 1.0000 | Freq: Once | ORAL | 0 refills | Status: AC

## 2023-09-08 NOTE — Progress Notes (Signed)

## 2023-09-23 ENCOUNTER — Encounter: Payer: Self-pay | Admitting: Internal Medicine

## 2023-09-29 ENCOUNTER — Ambulatory Visit: Payer: 59

## 2023-10-01 ENCOUNTER — Encounter: Payer: Self-pay | Admitting: Internal Medicine

## 2023-10-01 ENCOUNTER — Ambulatory Visit: Payer: 59 | Admitting: Internal Medicine

## 2023-10-01 VITALS — BP 113/70 | HR 88 | Temp 97.3°F | Resp 32 | Ht 64.0 in | Wt 218.0 lb

## 2023-10-01 DIAGNOSIS — Z1211 Encounter for screening for malignant neoplasm of colon: Secondary | ICD-10-CM | POA: Diagnosis present

## 2023-10-01 DIAGNOSIS — D124 Benign neoplasm of descending colon: Secondary | ICD-10-CM

## 2023-10-01 DIAGNOSIS — D122 Benign neoplasm of ascending colon: Secondary | ICD-10-CM

## 2023-10-01 DIAGNOSIS — K648 Other hemorrhoids: Secondary | ICD-10-CM

## 2023-10-01 MED ORDER — SODIUM CHLORIDE 0.9 % IV SOLN: 500.0000 mL | Freq: Once | INTRAVENOUS | Status: DC

## 2023-10-01 NOTE — Progress Notes (Signed)
 Called to room to assist during endoscopic procedure.  Patient ID and intended procedure confirmed with present staff. Received instructions for my participation in the procedure from the performing physician.

## 2023-10-01 NOTE — Progress Notes (Signed)
 Report to PACU, RN, vss, BBS= Clear.

## 2023-10-01 NOTE — Patient Instructions (Addendum)
 Resume your usual medications today as ordered.  Read your discharge instructions.   YOU HAD AN ENDOSCOPIC PROCEDURE TODAY AT THE [facility]:   Refer to the procedure report that was given to you for any specific questions about what was found during the examination.  If the procedure report does not answer your questions, please call your gastroenterologist to clarify.  If you requested that your care partner not be given the details of your procedure findings, then the procedure report has been included in a sealed envelope for you to review at your convenience later.  YOU SHOULD EXPECT: Some feelings of bloating in the abdomen. Passage of more gas than usual.  Walking can help get rid of the air that was put into your GI tract during the procedure and reduce the bloating. If you had a lower endoscopy (such as a colonoscopy or flexible sigmoidoscopy) you may notice spotting of blood in your stool or on the toilet paper. If you underwent a bowel prep for your procedure, you may not have a normal bowel movement for a few days.  Please Note:  You might notice some irritation and congestion in your nose or some drainage.  This is from the oxygen used during your procedure.  There is no need for concern and it should clear up in a day or so.  SYMPTOMS TO REPORT IMMEDIATELY:  Following lower endoscopy (colonoscopy or flexible sigmoidoscopy):  Excessive amounts of blood in the stool  Significant tenderness or worsening of abdominal pains  Swelling of the abdomen that is new, acute  Fever of 100F or higher   For urgent or emergent issues, a gastroenterologist can be reached at any hour by calling [phone]. Do not use MyChart messaging for urgent concerns.    DIET:  We do recommend a small meal at first, but then you may proceed to your regular diet.  Drink plenty of fluids but you should avoid alcoholic beverages for 24 hours.  ACTIVITY:  You should plan to take it easy for the  rest of today and you should NOT DRIVE or use heavy machinery until tomorrow (because of the sedation medicines used during the test).    FOLLOW UP: Our staff will call the number listed on your records the next business day following your procedure.  We will call around 7:15- 8:00 am to check on you and address any questions or concerns that you may have regarding the information given to you following your procedure. If we do not reach you, we will leave a message.     If any biopsies were taken you will be contacted by phone or by letter within the next 1-3 weeks.  Please call us at [phone] if you have not heard about the biopsies in 3 weeks.    SIGNATURES/CONFIDENTIALITY: You and/or your care partner have signed paperwork which will be entered into your electronic medical record.  These signatures attest to the fact that that the information above on your After Visit Summary has been reviewed and is understood.  Full responsibility of the confidentiality of this discharge information lies with you and/or your care-partner.

## 2023-10-01 NOTE — Progress Notes (Signed)
 Pt's states no medical or surgical changes since previsit or office visit.

## 2023-10-01 NOTE — Op Note (Signed)
 [facility] Patient Name: [name] Skinner Procedure Date: 10/01/2023 9:00 AM MRN: [id number] Endoscopist: Madelyn Brunner Hinckley , , [id number] Age: 58 Referring MD:  Date of Birth: 1966-03-19 Gender: Female Account #: [id number] Procedure:                Colonoscopy Indications:              Screening for colorectal malignant neoplasm Medicines:                Monitored Anesthesia Care Procedure:                Pre-Anesthesia Assessment:                           - Prior to the procedure, a History and Physical                            was performed, and patient medications and                            allergies were reviewed. The patient's tolerance of                            previous anesthesia was also reviewed. The risks                            and benefits of the procedure and the sedation                            options and risks were discussed with the patient.                            All questions were answered, and informed consent                            was obtained. Prior Anticoagulants: The patient has                            taken no anticoagulant or antiplatelet agents. ASA                            Grade Assessment: II - A patient with mild systemic                            disease. After reviewing the risks and benefits,                            the patient was deemed in satisfactory condition to                            undergo the procedure.                           After obtaining informed consent, the colonoscope  was passed under direct vision. Throughout the                            procedure, the patient's blood pressure, pulse, and                            oxygen saturations were monitored continuously. The                            Olympus CF-HQ190L ([id number]) Colonoscope was                            introduced through the anus and advanced to the the                            terminal  ileum. The colonoscopy was performed                            without difficulty. The patient tolerated the                            procedure well. The quality of the bowel                            preparation was good. The terminal ileum, ileocecal                            valve, appendiceal orifice, and rectum were                            photographed. Scope In: 9:18:59 AM Scope Out: 9:36:48 AM Scope Withdrawal Time: 0 hours 8 minutes 56 seconds  Total Procedure Duration: 0 hours 17 minutes 49 seconds  Findings:                 The terminal ileum appeared normal.                           Two sessile polyps were found in the ascending                            colon. The polyps were 3 to 4 mm in size. These                            polyps were removed with a cold snare. Resection                            and retrieval were complete.                           A 4 mm polyp was found in the descending colon. The                            polyp was sessile. The polyp was removed with a  cold snare. Resection and retrieval were complete.                           Non-bleeding internal hemorrhoids were found during                            retroflexion. Complications:            No immediate complications. Estimated Blood Loss:     Estimated blood loss was minimal. Impression:               - The examined portion of the ileum was normal.                           - Two 3 to 4 mm polyps in the ascending colon,                            removed with a cold snare. Resected and retrieved.                           - One 4 mm polyp in the descending colon, removed                            with a cold snare. Resected and retrieved.                           - Non-bleeding internal hemorrhoids. Recommendation:           - Discharge patient to home (with escort).                           - Await pathology results.                           - The  findings and recommendations were discussed                            with the patient. Dr Particia Lather "Alan Ripper" [place],  10/01/2023 9:39:59 AM

## 2023-10-01 NOTE — Progress Notes (Signed)
 GASTROENTEROLOGY PROCEDURE H&P NOTE   Primary Care Physician: Diamantina Providence, FNP    Reason for Procedure:   Colon cancer screening  Plan:    Colonoscopy  Patient is appropriate for endoscopic procedure(s) in the ambulatory ([facility]) setting.  The nature of the procedure, as well as the risks, benefits, and alternatives were carefully and thoroughly reviewed with the patient. Ample time for discussion and questions allowed. The patient understood, was satisfied, and agreed to proceed.     HPI: [name] Skinner is a 59 y.o. female who presents for colonoscopy for colon cancer screening. Denies blood in stools, changes in bowel habits, or unintentional weight loss. Denies family history of colon cancer. Last colonoscopy was 10 years ago and she was recommended for a 10 year follow up.  Past Medical History:  Diagnosis Date   Anxiety    Arthritis    Hypertension     Past Surgical History:  Procedure Laterality Date   CESAREAN SECTION     fibroid      Prior to Admission medications   Medication Sig Start Date End Date Taking? Authorizing Provider  cephALEXin (KEFLEX) 500 MG capsule Take 1 capsule (500 mg total) by mouth 3 (three) times daily. Patient not taking: Reported on 09/08/2023 10/10/22   Raspet, Noberto Retort, PA-C  estradiol (ESTRACE) 0.5 MG tablet Take 1 tablet by mouth every evening.    [provider]  FLUoxetine (PROZAC) 20 MG tablet Take 20 mg by mouth daily. Patient not taking: Reported on 09/08/2023 03/16/22   [provider]  fluticasone (FLONASE) 50 MCG/ACT nasal spray Place 1 spray into both nostrils daily for 3 days. 03/22/22 03/25/22  Gustavus Bryant, FNP  metroNIDAZOLE (FLAGYL) 500 MG tablet metronidazole 500 mg tablet 12/02/18   [provider]  naproxen (NAPROSYN) 500 MG tablet Take 1 tablet (500 mg total) by mouth 2 (two) times daily. Patient not taking: Reported on 09/08/2023 10/10/22   Raspet, Denny Peon K, PA-C  nystatin-triamcinolone (MYCOLOG  II) cream APPLY TO THE AFFECTED AREA(S) BY TOPICAL ROUTE 2 TIMES PER DAY IN [facility] AND EVENING 09/09/22   [provider]  olmesartan-hydrochlorothiazide (BENICAR HCT) 40-25 MG per tablet Take 1 tablet by mouth daily.    [provider]  progesterone (PROMETRIUM) 100 MG capsule Take 1 capsule by mouth at bedtime.    [provider]  promethazine (PHENERGAN) 12.5 MG tablet Take 1 tablet twice a day by oral route as needed for 10 days. 12/25/21   [provider]  tirzepatide Greggory Keen) 12.5 MG/0.5ML Pen Inject 12.5 mg into the skin once a week. 09/23/22     tirzepatide (MOUNJARO) 12.5 MG/0.5ML Pen Inject 12.5 mg into the skin once a week as directed. 09/23/22   Diamantina Providence, FNP  tirzepatide [facility]) 15 MG/0.5ML Pen Inject 15 mg into the skin once a week as directed. 09/23/22     tirzepatide (MOUNJARO) 15 MG/0.5ML Pen INJECT 0.5ML SUBCUTANEOUSLY ONCE WEEKLY AS DIRECTED 07/29/22   [provider]  triamcinolone cream (KENALOG) 0.1 % APPLY A THIN LAYER TO THE AFFECTED AREA(S) BY TOPICAL ROUTE 2 TIMES PER DAY 03/16/22   [provider]  venlafaxine XR (EFFEXOR-XR) 75 MG 24 hr capsule Take 75 mg by mouth daily.    [provider]    Current Outpatient Medications  Medication Sig Dispense Refill   cephALEXin (KEFLEX) 500 MG capsule Take 1 capsule (500 mg total) by mouth 3 (three) times daily. (Patient not taking: Reported on 09/08/2023) 21 capsule  0   estradiol (ESTRACE) 0.5 MG tablet Take 1 tablet by mouth every evening.     FLUoxetine (PROZAC) 20 MG tablet Take 20 mg by mouth daily. (Patient not taking: Reported on 09/08/2023)     fluticasone (FLONASE) 50 MCG/ACT nasal spray Place 1 spray into both nostrils daily for 3 days. 16 g 0   metroNIDAZOLE (FLAGYL) 500 MG tablet metronidazole 500 mg tablet     naproxen (NAPROSYN) 500 MG tablet Take 1 tablet (500 mg total) by mouth 2 (two) times daily. (Patient not taking: Reported on 09/08/2023) 14  tablet 0   nystatin-triamcinolone (MYCOLOG II) cream APPLY TO THE AFFECTED AREA(S) BY TOPICAL ROUTE 2 TIMES PER DAY IN THEMORNING AND EVENING     olmesartan-hydrochlorothiazide (BENICAR HCT) 40-25 MG per tablet Take 1 tablet by mouth daily.     progesterone (PROMETRIUM) 100 MG capsule Take 1 capsule by mouth at bedtime.     promethazine (PHENERGAN) 12.5 MG tablet Take 1 tablet twice a day by oral route as needed for 10 days.     tirzepatide (MOUNJARO) 12.5 MG/0.5ML Pen Inject 12.5 mg into the skin once a week. 2 mL 1   tirzepatide (MOUNJARO) 12.5 MG/0.5ML Pen Inject 12.5 mg into the skin once a week as directed. 2 mL 1   tirzepatide (MOUNJARO) 15 MG/0.5ML Pen Inject 15 mg into the skin once a week as directed. 2 mL 1   tirzepatide (MOUNJARO) 15 MG/0.5ML Pen INJECT 0.5ML SUBCUTANEOUSLY ONCE WEEKLY AS DIRECTED     triamcinolone cream (KENALOG) 0.1 % APPLY A THIN LAYER TO THE AFFECTED AREA(S) BY TOPICAL ROUTE 2 TIMES PER DAY     venlafaxine XR (EFFEXOR-XR) 75 MG 24 hr capsule Take 75 mg by mouth daily.     Current Facility-Administered Medications  Medication Dose Route Frequency Provider Last Rate Last Admin   0.9 %  sodium chloride infusion  500 mL Intravenous Once Imogene Burn, MD        Allergies as of 10/01/2023   (No Known Allergies)    Family History  Problem Relation Age of Onset   Hypertension Mother    Diabetes Mother    Hypertension Father    Diabetes Maternal Grandmother    Breast cancer Neg Hx    Colon cancer Neg Hx    Colon polyps Neg Hx    Esophageal cancer Neg Hx    Rectal cancer Neg Hx    Stomach cancer Neg Hx     Social History   Socioeconomic History   Marital status: Married    Spouse name: Not on file   Number of children: Not on file   Years of education: Not on file   Highest education level: Not on file  Occupational History   Not on file  Tobacco Use   Smoking status: Never   Smokeless tobacco: Never  Vaping Use   Vaping status: Never Used   Substance and Sexual Activity   Alcohol use: No   Drug use: No   Sexual activity: Yes    Birth control/protection: Pill  Other Topics Concern   Not on file  Social History Narrative   Not on file   Social Drivers of Health   Financial Resource Strain: Not on file  Food Insecurity: Not on file  Transportation Needs: Not on file  Physical Activity: Not on file  Stress: Not on file  Social Connections: Not on file  Intimate Partner Violence: Not on file    Physical Exam: Vital signs  in last 24 hours: BP 132/84   Pulse 96   Temp (!) 97.3 F (36.3 C) (Temporal)   Ht 5\' 4"  (1.626 m)   Wt 218 lb (98.9 kg)   LMP 01/29/2016   SpO2 100%   BMI 37.42 kg/m  GEN: NAD EYE: Sclerae anicteric ENT: MMM CV: Non-tachycardic Pulm: No increased work of breathing GI: Soft, NT/ND NEURO:  Alert & Oriented   Eulah Pont, MD [facility]  10/01/2023 8:57 AM

## 2023-10-04 ENCOUNTER — Telehealth: Payer: Self-pay | Admitting: *Deleted

## 2023-10-04 NOTE — Telephone Encounter (Signed)
  Follow up Call-     10/01/2023    8:45 AM  Call back number  Post procedure Call Back phone  # [phone]  Permission to leave phone message Yes     Patient questions:  Do you have a fever, pain , or abdominal swelling? No. Pain Score  0 *  Have you tolerated food without any problems? Yes.    Have you been able to return to your normal activities? Yes.    Do you have any questions about your discharge instructions: Diet   No. Medications  No. Follow up visit  No.  Do you have questions or concerns about your Care? No.  Actions: * If pain score is 4 or above: No action needed, pain <4.

## 2023-10-05 LAB — SURGICAL PATHOLOGY

## 2023-10-07 ENCOUNTER — Encounter: Payer: Self-pay | Admitting: Internal Medicine

## 2023-10-09 IMAGING — MG MM DIGITAL SCREENING BILAT W/ TOMO AND CAD
8 series · 8 of 24 positions shown · non-contrast
Comparison: Previous exam(s).

ACR Breast Density Category a: The breast tissue is almost entirely
fatty.

CLINICAL DATA: Screening.

EXAM:
DIGITAL SCREENING BILATERAL MAMMOGRAM WITH TOMOSYNTHESIS AND CAD
TECHNIQUE: Bilateral screening digital craniocaudal and mediolateral oblique
mammograms were obtained. Bilateral screening digital breast
tomosynthesis was performed. The images were evaluated with
computer-aided detection.

[R MLO synth-2D]
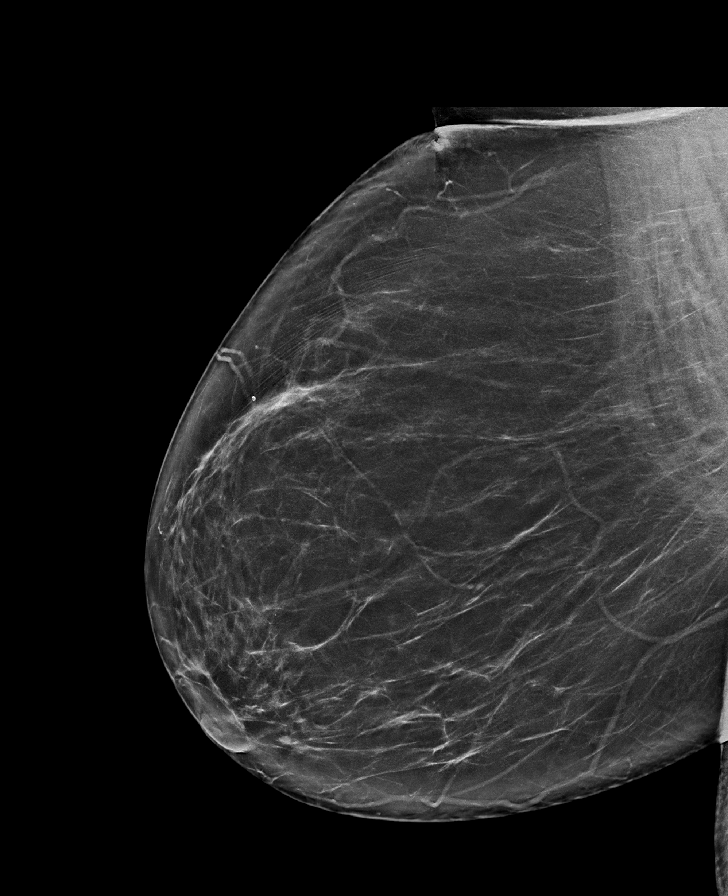

[L CC synth-2D]
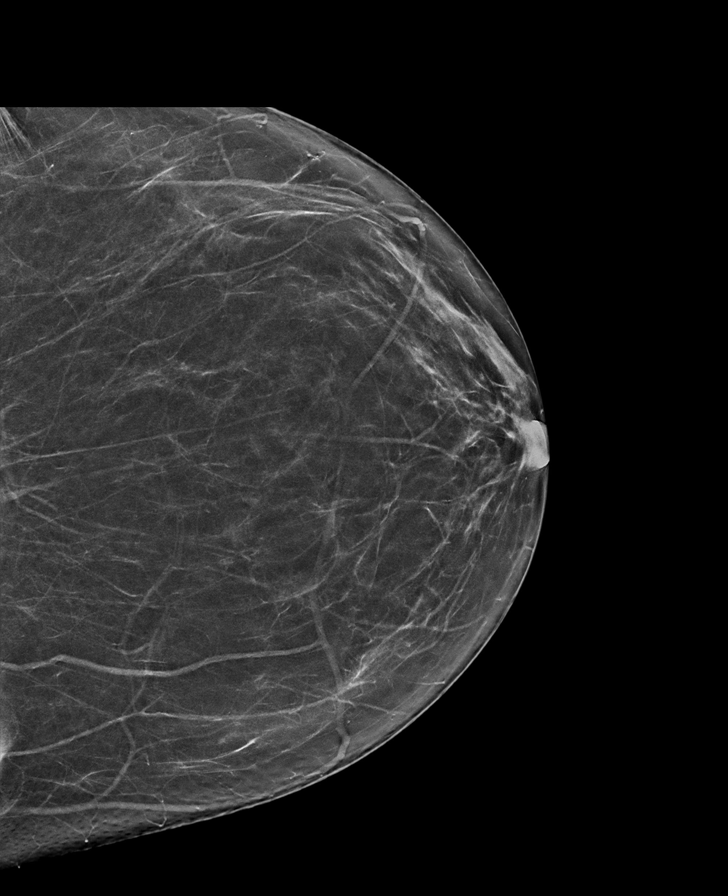

[L MLO synth-2D]
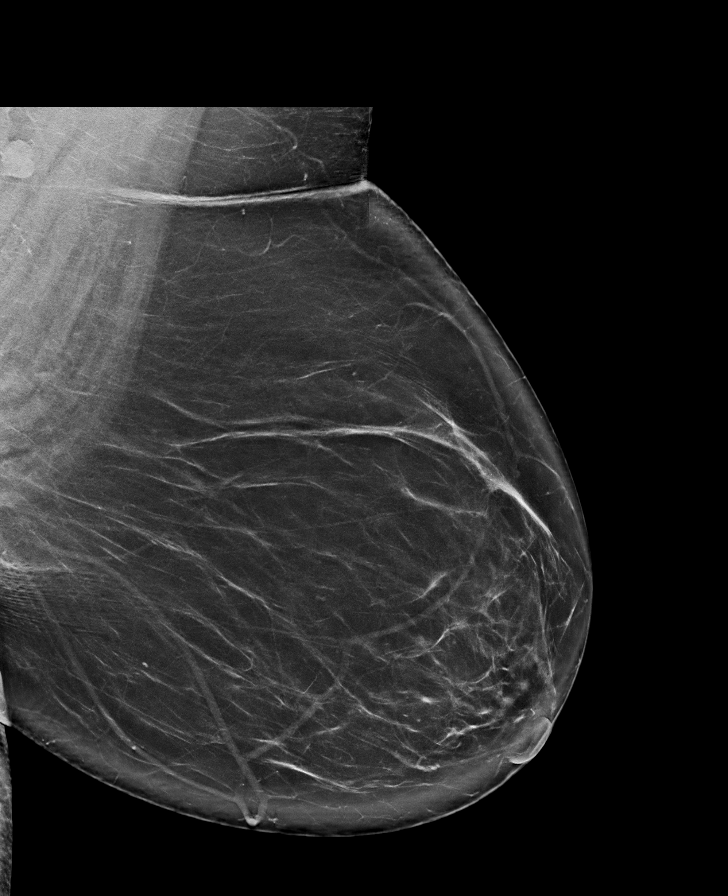

[R CC synth-2D]
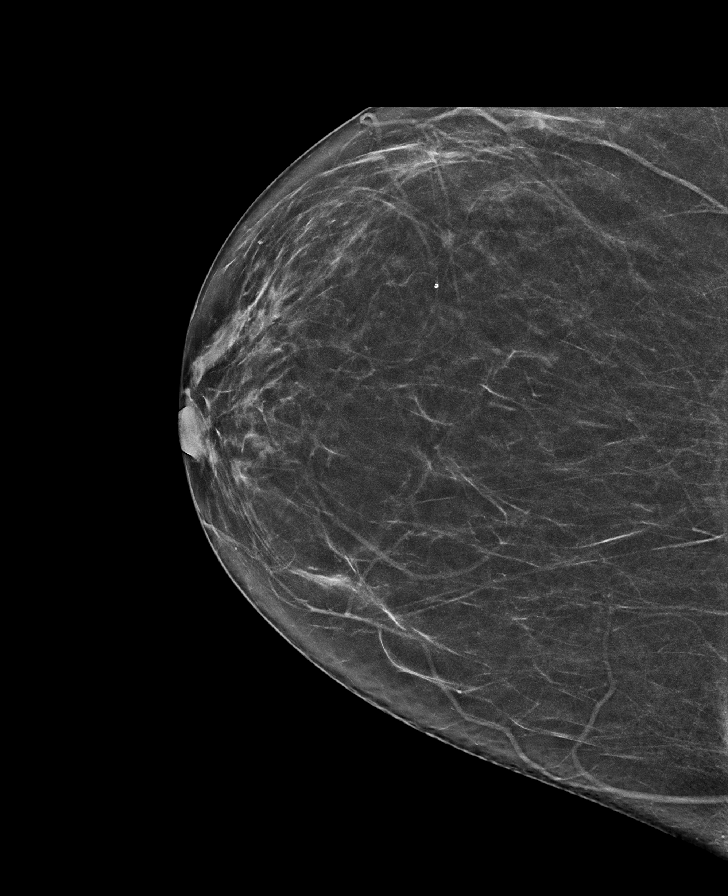

[R CC tomo · tomo slice 37/72.0]
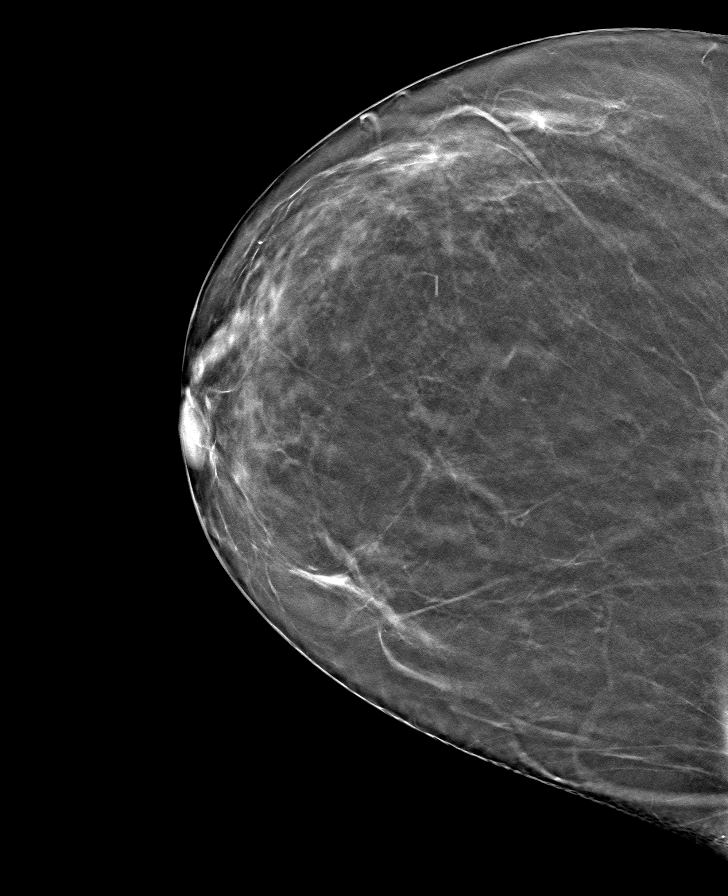

[L MLO tomo · tomo slice 49/98.0]
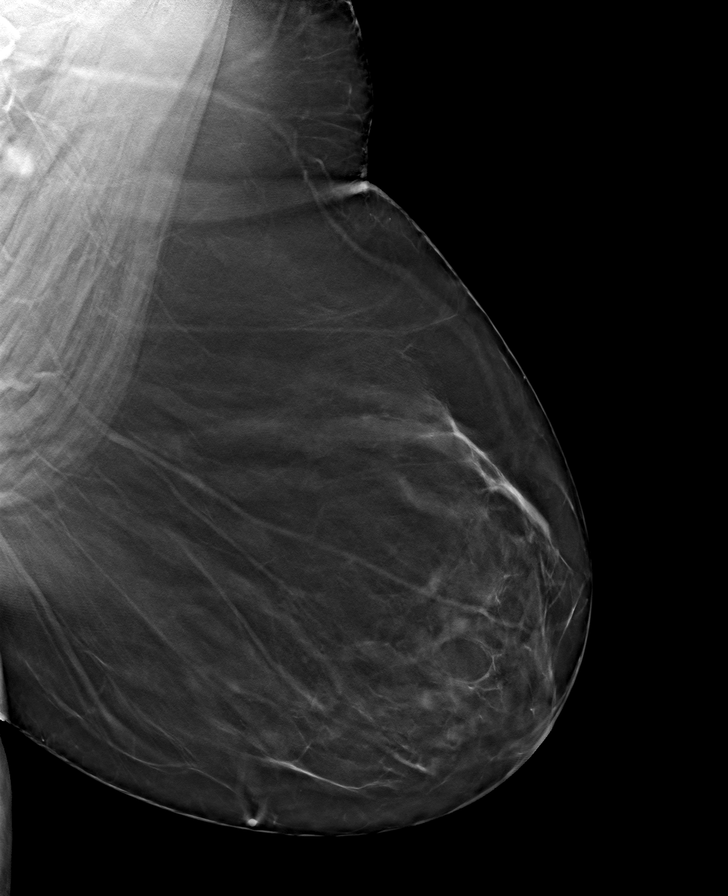

[L CC tomo · tomo slice 37/73.0]
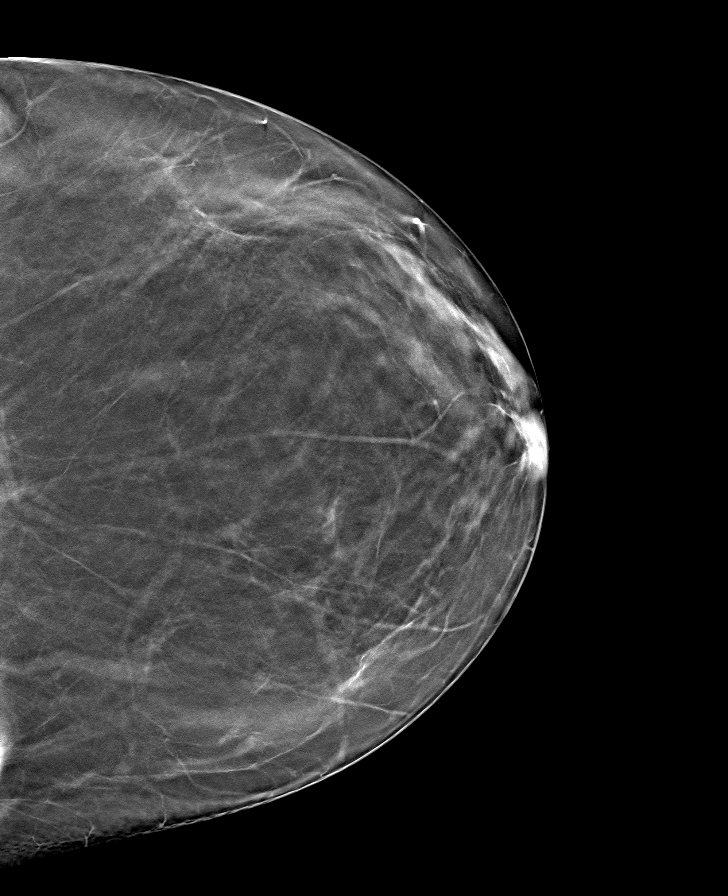

[R MLO tomo · tomo slice 47/93.0]
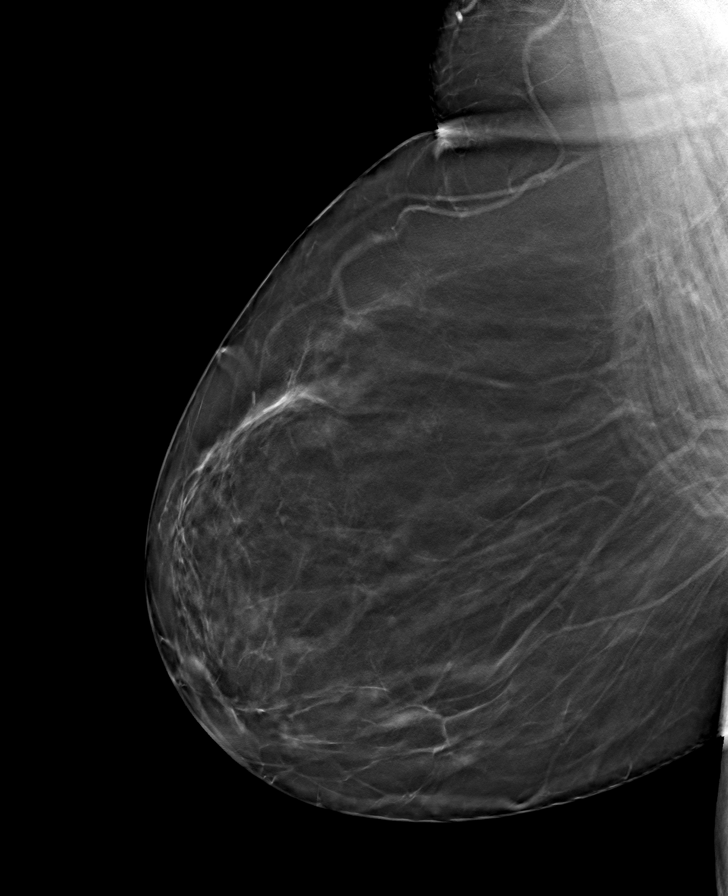

[8 of 24 positions shown; findings below may reference images not displayed]

FINDINGS: There are no findings suspicious for malignancy.
IMPRESSION: No mammographic evidence of malignancy. A result letter of this
screening mammogram will be mailed directly to the patient.

RECOMMENDATION:
Screening mammogram in one year. (Code:0E-3-N98)

BI-RADS CATEGORY  1: Negative.

## 2023-10-13 ENCOUNTER — Ambulatory Visit
Admission: RE | Admit: 2023-10-13 | Discharge: 2023-10-13 | Disposition: A | Discharge status: Home / Self Care | Source: Ambulatory Visit | Attending: Nurse Practitioner | Admitting: Nurse Practitioner

## 2023-10-13 DIAGNOSIS — Z1231 Encounter for screening mammogram for malignant neoplasm of breast: Secondary | ICD-10-CM

## 2024-03-22 ENCOUNTER — Ambulatory Visit
Admission: EM | Admit: 2024-03-22 | Discharge: 2024-03-22 | Disposition: A | Discharge status: Home / Self Care | Attending: Nurse Practitioner | Admitting: Nurse Practitioner

## 2024-03-22 ENCOUNTER — Ambulatory Visit (INDEPENDENT_AMBULATORY_CARE_PROVIDER_SITE_OTHER)

## 2024-03-22 ENCOUNTER — Encounter: Payer: Self-pay | Admitting: Emergency Medicine

## 2024-03-22 DIAGNOSIS — M79674 Pain in right toe(s): Secondary | ICD-10-CM

## 2024-03-22 DIAGNOSIS — R5383 Other fatigue: Secondary | ICD-10-CM

## 2024-03-22 DIAGNOSIS — M19079 Primary osteoarthritis, unspecified ankle and foot: Secondary | ICD-10-CM | POA: Diagnosis not present

## 2024-03-22 LAB — POC SOFIA SARS ANTIGEN FIA: SARS Coronavirus 2 Ag: NEGATIVE

## 2024-03-22 MED ORDER — PREDNISONE 20 MG PO TABS: 40.0000 mg | ORAL_TABLET | Freq: Every day | ORAL | 0 refills | Status: AC

## 2024-03-22 NOTE — ED Provider Notes (Signed)
 [facility]    CSN: [id number] Arrival date & time: 03/22/24  0849      History   Chief Complaint Chief Complaint  Patient presents with   Foot Pain    HPI [name] Skinner is a 58 y.o. female.   Discussed the use of AI scribe software for clinical note transcription with the patient, who gave verbal consent to proceed.   The patient presents with two primary concerns: pain in the right big toe and fatigue. The patient also had concerns about COVID.   The patient reports fatigue for about one to two weeks. She denies any fevers, chills, body ache, congestion, cough, diarrhea, nausea, vomiting, headache, dizziness or dysuria.  Regarding the toe pain, the patient states it has been hurting for about a week, particularly at night, causing her to limp when she walks.  The pain is better during the daytime but aches when walking. The patient notes swelling in the affected area.   The patient has a history of diabetes and is currently taking metformin. They report significant weight loss of 60 pounds and a reduction in their A1C to 4.9.  The following portions of the patient's history were reviewed and updated as appropriate: allergies, current medications, past family history, past medical history, past social history, past surgical history, and problem list.      Past Medical History:  Diagnosis Date   Anxiety    Arthritis    Hypertension     Patient Active Problem List   Diagnosis Date Noted   Essential hypertension 12/12/2012   BMI 40.0-44.9, adult (HCC) 12/12/2012   Anxiety state 12/12/2012    Past Surgical History:  Procedure Laterality Date   CESAREAN SECTION     fibroid      OB History   No obstetric history on file.      Home Medications    Prior to Admission medications   Medication Sig Start Date End Date Taking? Authorizing Provider  predniSONE  (DELTASONE ) 20 MG tablet Take 2 tablets (40 mg total) by mouth daily for 5 days. 03/22/24  03/27/24 Yes Shailee Foots, Lucie, FNP  nystatin-triamcinolone  (MYCOLOG II) cream APPLY TO THE AFFECTED AREA(S) BY TOPICAL ROUTE 2 TIMES PER DAY IN [facility] AND EVENING 09/09/22   [provider]  olmesartan-hydrochlorothiazide (BENICAR HCT) 40-25 MG per tablet Take 1 tablet by mouth daily.    [provider]  progesterone (PROMETRIUM) 100 MG capsule Take 1 capsule by mouth at bedtime.    [provider]  promethazine  (PHENERGAN ) 12.5 MG tablet Take 1 tablet twice a day by oral route as needed for 10 days. 12/25/21   [provider]  tirzepatide  (MOUNJARO ) 12.5 MG/0.5ML Pen Inject 12.5 mg into the skin once a week. 09/23/22     tirzepatide  (MOUNJARO ) 12.5 MG/0.5ML Pen Inject 12.5 mg into the skin once a week as directed. 09/23/22   Lenon Nell SAILOR, FNP  tirzepatide  (MOUNJARO ) 15 MG/0.5ML Pen Inject 15 mg into the skin once a week as directed. 09/23/22     tirzepatide  (MOUNJARO ) 15 MG/0.5ML Pen INJECT 0.5ML SUBCUTANEOUSLY ONCE WEEKLY AS DIRECTED 07/29/22   [provider]  triamcinolone  cream (KENALOG ) 0.1 % APPLY A THIN LAYER TO THE AFFECTED AREA(S) BY TOPICAL ROUTE 2 TIMES PER DAY 03/16/22   [provider]  venlafaxine XR (EFFEXOR-XR) 75 MG 24 hr capsule Take 75 mg by mouth daily.    [provider]    Family History Family History  Problem Relation Age of Onset  Hypertension Mother    Diabetes Mother    Hypertension Father    Diabetes Maternal Grandmother    Breast cancer Neg Hx    Colon cancer Neg Hx    Colon polyps Neg Hx    Esophageal cancer Neg Hx    Rectal cancer Neg Hx    Stomach cancer Neg Hx     Social History Social History   Tobacco Use   Smoking status: Never   Smokeless tobacco: Never  Vaping Use   Vaping status: Never Used  Substance Use Topics   Alcohol use: No   Drug use: No     Allergies   Patient has no known allergies.   Review of Systems Review of Systems  Constitutional:  Positive for  fatigue (x1-2 weeks). Negative for activity change, appetite change, chills and fever.  HENT: Negative.    Respiratory: Negative.    Gastrointestinal: Negative.   Musculoskeletal:  Positive for arthralgias (right big toe pain) and gait problem (due to toe pain).  Neurological:  Negative for weakness and numbness.  All other systems reviewed and are negative.    Physical Exam Triage Vital Signs ED Triage Vitals  Encounter Vitals Group     BP 03/22/24 0916 101/69     Girls Systolic BP Percentile --      Girls Diastolic BP Percentile --      Boys Systolic BP Percentile --      Boys Diastolic BP Percentile --      Pulse Rate 03/22/24 0916 (!) 109     Resp 03/22/24 0916 18     Temp 03/22/24 0916 98.1 F (36.7 C)     Temp Source 03/22/24 0916 Oral     SpO2 03/22/24 0916 96 %     Weight --      Height --      Head Circumference --      Peak Flow --      Pain Score 03/22/24 0917 4     Pain Loc --      Pain Education --      Exclude from Growth Chart --    No data found.  Updated Vital Signs BP 101/69 (BP Location: Left Arm)   Pulse (!) 109   Temp 98.1 F (36.7 C) (Oral)   Resp 18   LMP 01/29/2016   SpO2 96%   Visual Acuity Right Eye Distance:   Left Eye Distance:   Bilateral Distance:    Right Eye Near:   Left Eye Near:    Bilateral Near:     Physical Exam Vitals reviewed.  Constitutional:      General: She is awake. She is not in acute distress.    Appearance: Normal appearance. She is well-developed. She is not ill-appearing, toxic-appearing or diaphoretic.  HENT:     Head: Normocephalic.     Right Ear: Hearing normal.     Left Ear: Hearing normal.     Nose: Nose normal.     Mouth/Throat:     Mouth: Mucous membranes are moist.  Eyes:     General: Vision grossly intact.     Conjunctiva/sclera: Conjunctivae normal.  Cardiovascular:     Rate and Rhythm: Normal rate and regular rhythm.     Pulses:          Dorsalis pedis pulses are 2+ on the right side.      Heart sounds: Normal heart sounds.  Pulmonary:     Effort: Pulmonary effort is normal.  Breath sounds: Normal breath sounds and air entry.  Abdominal:     Palpations: Abdomen is soft.  Musculoskeletal:        General: Normal range of motion.     Cervical back: Full passive range of motion without pain, normal range of motion and neck supple.     Right foot: Normal range of motion. No deformity.  Feet:     Right foot:     Skin integrity: Skin integrity normal.     Toenail Condition: Right toenails are normal.     Comments: Tenderness to the right great toe joint without any deformity, redness or swelling.  Skin:    General: Skin is warm and dry.  Neurological:     General: No focal deficit present.     Mental Status: She is alert and oriented to person, place, and time.  Psychiatric:        Speech: Speech normal.        Behavior: Behavior is cooperative.      UC Treatments / Results  Labs (all labs ordered are listed, but only abnormal results are displayed) Labs Reviewed  POC SOFIA SARS ANTIGEN FIA - Normal  URIC ACID    EKG   Radiology DG Toe Great Right Result Date: 03/22/2024 CLINICAL DATA:  pain without injury EXAM: RIGHT GREAT TOE COMPARISON:  None Available. FINDINGS: No acute fracture or dislocation. Degenerative changes of the first MTP joint. Soft tissues are unremarkable. No radiopaque foreign body. IMPRESSION: 1. No acute fracture or dislocation. 2. Degenerative changes of the first MTP joint. Electronically Signed   By: Rogelia Myers M.D.   On: 03/22/2024 10:07    Procedures Procedures (including critical care time)  Medications Ordered in UC Medications - No data to display  Initial Impression / Assessment and Plan / UC Course  I have reviewed the triage vital signs and the nursing notes.  Pertinent labs & imaging results that were available during my care of the patient were reviewed by me and considered in my medical decision making (see  chart for details).     Patient presents with fatigue and right great toe pain without any known injury. COVID test was negative. She is afebrile and nontoxic. Physical exam unremarkable with the exception of mild tenderness to the right great toe. No swelling, redness or signs of infection.  X-ray of the toe showed no acute fracture or dislocation, though degenerative changes of the first MTP joint were noted. Uric acid level obtained to evaluate for possible gout; results are pending. Prednisone  prescribed for inflammation, with acetaminophen  recommended as needed for additional pain control. Patient advised to avoid OTC NSAIDs while on prednisone . Supportive care with warm Epsom salt soaks recommended. Patient instructed to follow up with primary care and will be notified if lab results are abnormal. ED precautions reviewed.  Today's evaluation has revealed no signs of a dangerous process. Discussed diagnosis with patient and/or guardian. Patient and/or guardian aware of their diagnosis, possible red flag symptoms to watch out for and need for close follow up. Patient and/or guardian understands verbal and written discharge instructions. Patient and/or guardian comfortable with plan and disposition.  Patient and/or guardian has a clear mental status at this time, good insight into illness (after discussion and teaching) and has clear judgment to make decisions regarding their care  Documentation was completed with the aid of voice recognition software. Transcription may contain typographical errors. Final Clinical Impressions(s) / UC Diagnoses   Final diagnoses:  Fatigue, unspecified  type  Great toe pain, right  Arthritis of metatarsophalangeal (MTP) joint of great toe     Discharge Instructions      ou were seen today for pain in your right great toe. Your X-ray did not show any acute fracture or dislocation but did reveal degenerative changes of the first MTP joint (big toe joint) which  means that you have arthritis in the joint at the base of your big toe.   A blood test for uric acid was done to check for possible gout, and results are pending. You will be notified only if results are abnormal; otherwise, you may view them on your MyChart account.  Take prednisone  exactly as prescribed to help reduce inflammation. You may also take acetaminophen  (Tylenol ) as needed for additional pain relief. Please avoid over-the-counter NSAIDs while on prednisone .  Soak your foot in warm Epsom salt water a few times daily to help with discomfort. Wear supportive footwear with stiff soles and padding.   Follow-up with your primary care provider or go to the ED if you develop worsening pain, swelling, redness, fever, or any new concerning symptoms.      ED Prescriptions     Medication Sig Dispense Auth. Provider   predniSONE  (DELTASONE ) 20 MG tablet Take 2 tablets (40 mg total) by mouth daily for 5 days. 10 tablet Iola Lukes, FNP      PDMP not reviewed this encounter.   Iola Lukes, [place] 03/22/24 1021

## 2024-03-22 NOTE — Discharge Instructions (Addendum)
 ou were seen today for pain in your right great toe. Your X-ray did not show any acute fracture or dislocation but did reveal degenerative changes of the first MTP joint (big toe joint) which means that you have arthritis in the joint at the base of your big toe.   A blood test for uric acid was done to check for possible gout, and results are pending. You will be notified only if results are abnormal; otherwise, you may view them on your MyChart account.  Take prednisone  exactly as prescribed to help reduce inflammation. You may also take acetaminophen  (Tylenol ) as needed for additional pain relief. Please avoid over-the-counter NSAIDs while on prednisone .  Soak your foot in warm Epsom salt water a few times daily to help with discomfort. Wear supportive footwear with stiff soles and padding.   Follow-up with your primary care provider or go to the ED if you develop worsening pain, swelling, redness, fever, or any new concerning symptoms.

## 2024-03-22 NOTE — ED Triage Notes (Signed)
 Pt c/o pain in right great toe for approx 1 week with no known injury.  Pt also requesting to have a Covid test.  Pt denies any symptoms other than fatigue

## 2024-03-23 ENCOUNTER — Ambulatory Visit: Payer: Self-pay | Admitting: Nurse Practitioner

## 2024-03-23 LAB — URIC ACID: Uric Acid: 7.1 mg/dL (ref 3.0–7.2)

## 2024-08-10 ENCOUNTER — Other Ambulatory Visit: Payer: Self-pay | Admitting: Nurse Practitioner

## 2024-08-10 DIAGNOSIS — Z1231 Encounter for screening mammogram for malignant neoplasm of breast: Secondary | ICD-10-CM

## 2024-10-03 ENCOUNTER — Ambulatory Visit
